# Patient Record
Sex: Male | Born: 2000 | Race: White | Hispanic: No | Marital: Single | Smoking: Current some day smoker
Health system: Southern US, Community
[De-identification: ages and names within clinical notes are randomized; demographics above are authoritative.]

## PROBLEM LIST (undated history)

## (undated) DIAGNOSIS — I350 Nonrheumatic aortic (valve) stenosis: Secondary | ICD-10-CM

## (undated) DIAGNOSIS — F913 Oppositional defiant disorder: Secondary | ICD-10-CM

## (undated) DIAGNOSIS — F988 Other specified behavioral and emotional disorders with onset usually occurring in childhood and adolescence: Secondary | ICD-10-CM

## (undated) HISTORY — PX: CARDIAC SURGERY: SHX584

---
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
## 2018-04-03 IMAGING — US US EXTREM LOW VENOUS*R*
1 series · 13 of 24 positions shown · non-contrast
Comparison: None.

ADDENDUM:
Addendum created to address a transcription error.
CLINICAL DATA: 15-year-old male with a history of pain and swelling



[Series 1: us extrem low venous*right* · 0.05mm/px · 13 of 34 slices shown]
[im 1/34]
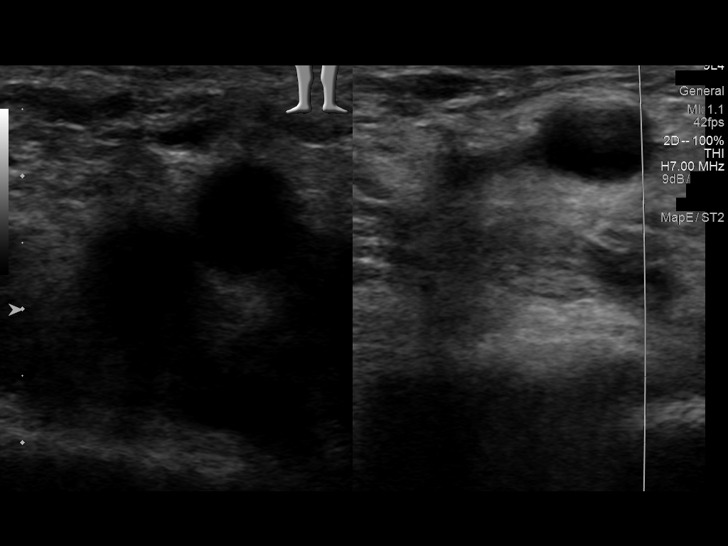
[im 3/34]
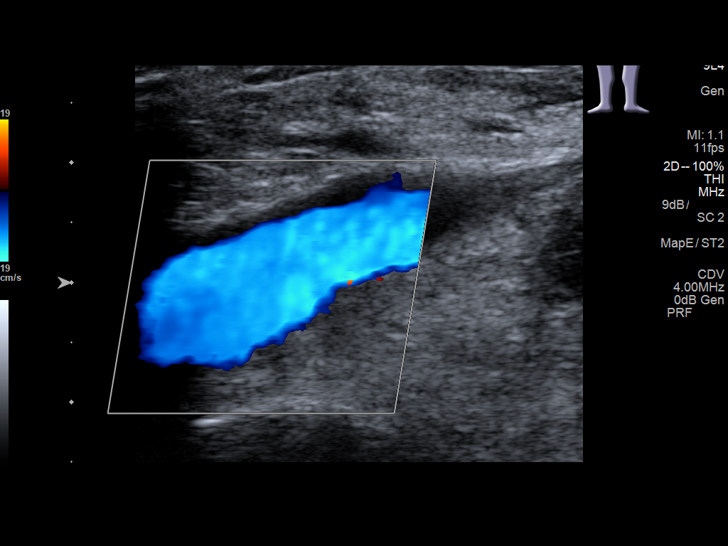
[im 6/34]
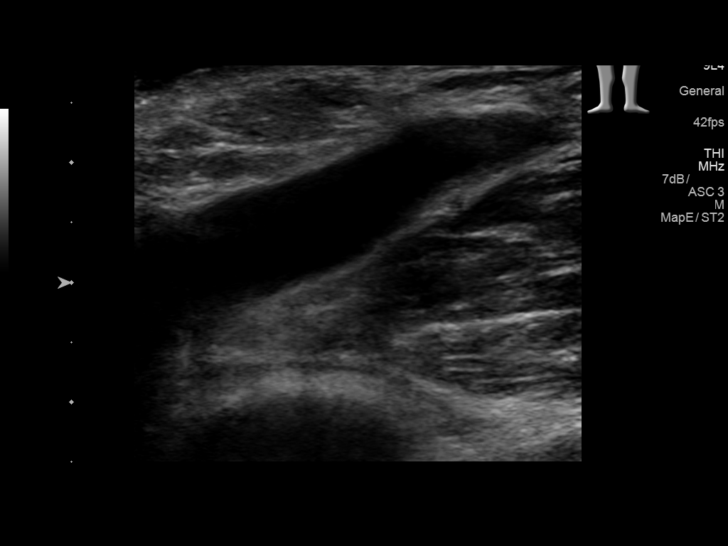
[im 9/34]
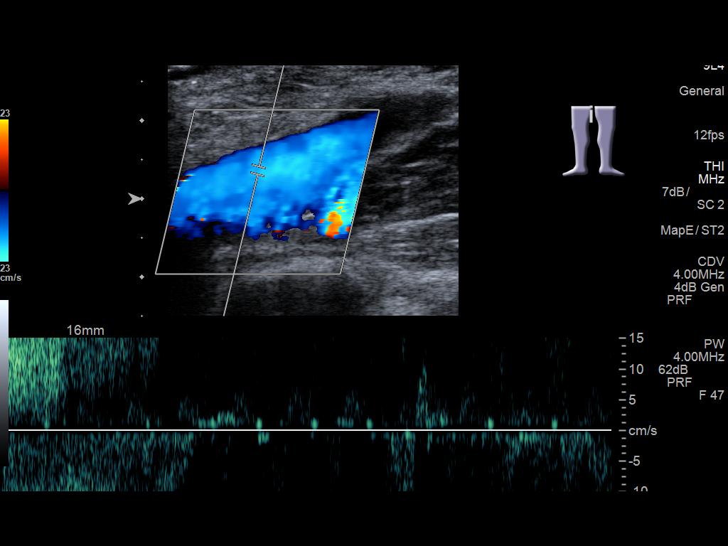
[im 12/34]
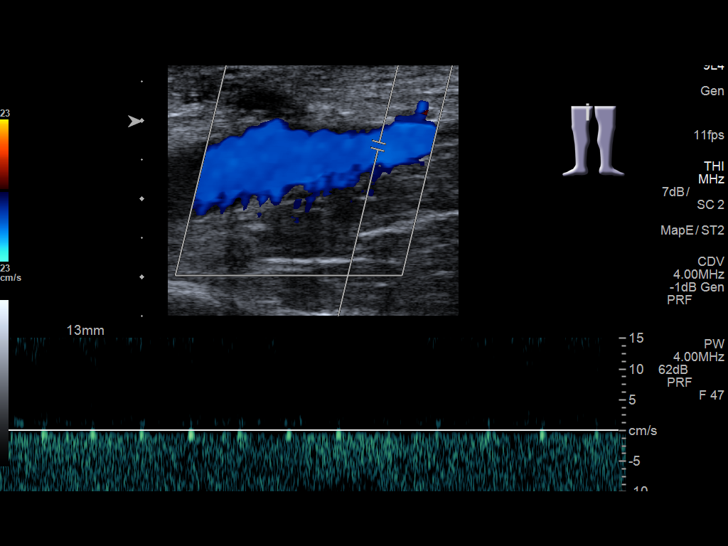
[im 15/34]
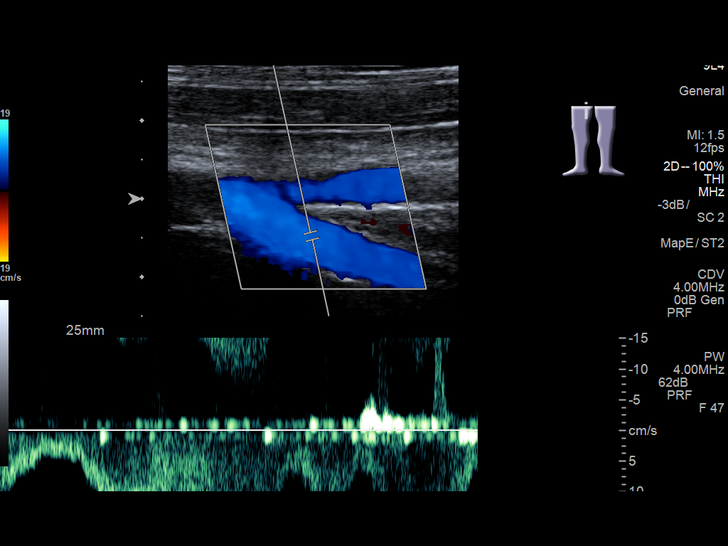
[im 18/34]
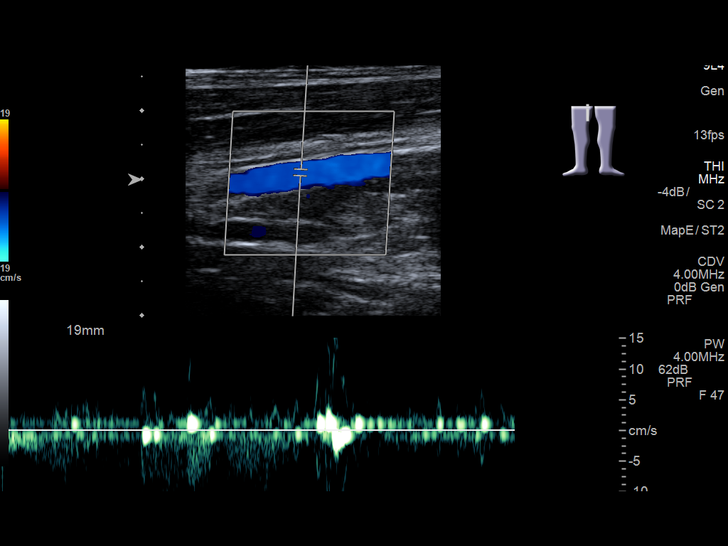
[im 19/34]
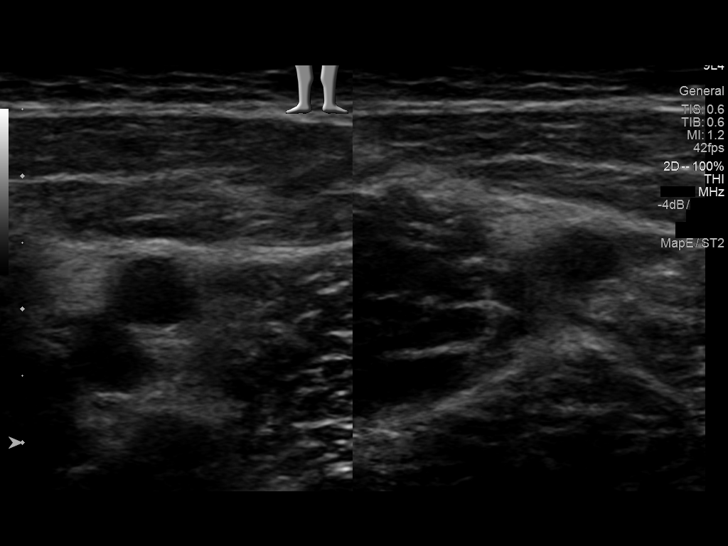
[im 22/34]
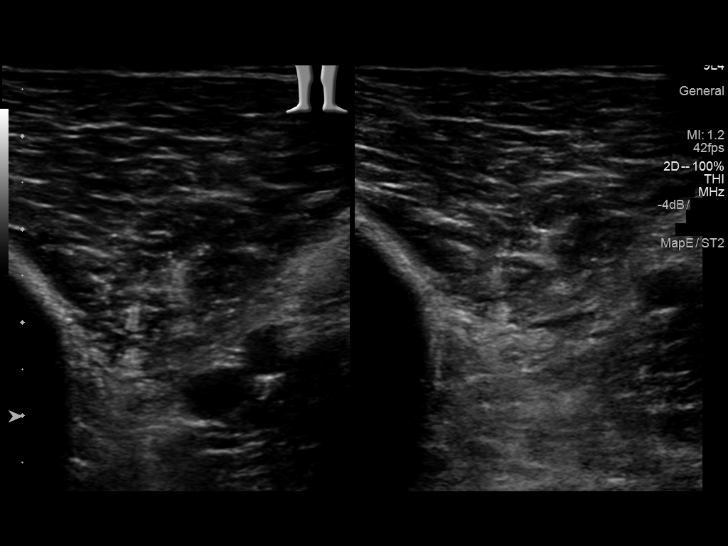
[im 25/34]
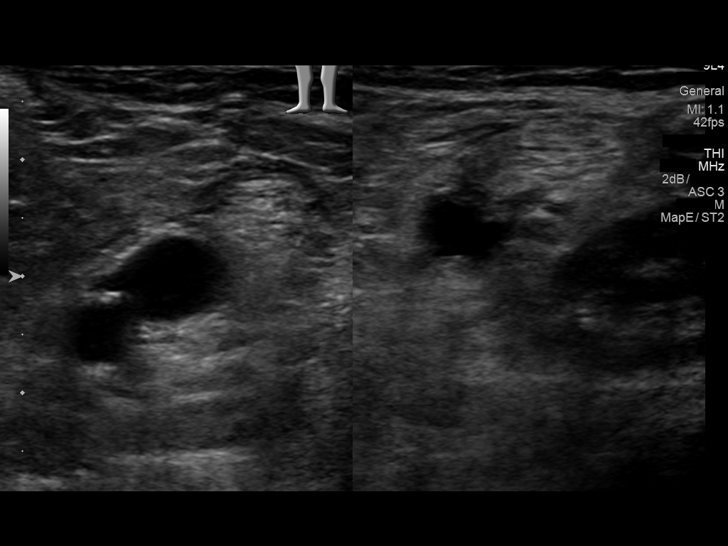
[im 28/34]
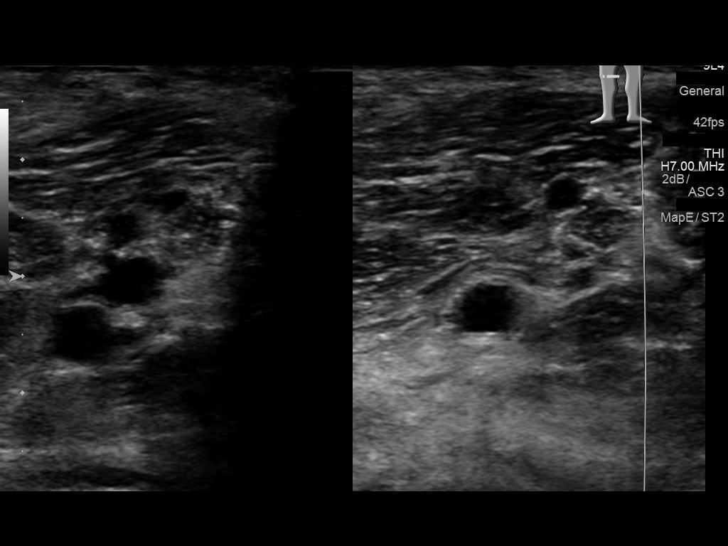
[im 31/34]
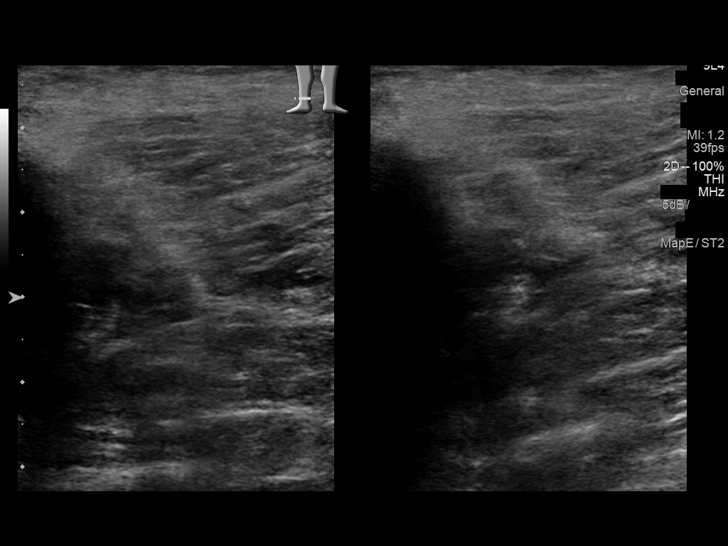
[im 34/34]
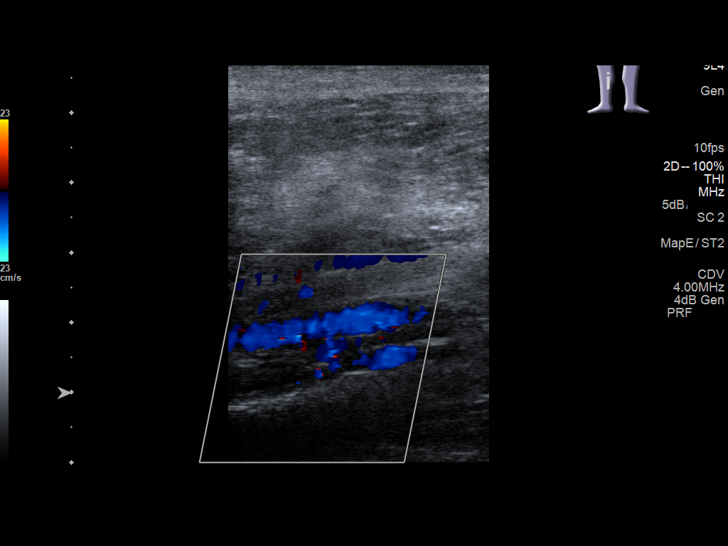

[13 of 24 positions shown; findings below may reference images not displayed]

IMPRESSION: Sonographic survey of the RIGHT lower extremity negative for DVT.

These results were discussed by telephone at the time of correction
on 12/09/2015 at [DATE] to Dr. JOVYDAS AGRA.
FINDINGS: Contralateral Common Femoral Vein: Respiratory phasicity is normal
and symmetric with the symptomatic side. No evidence of thrombus.
Normal compressibility.

Common Femoral Vein: No evidence of thrombus. Normal
compressibility, respiratory phasicity and response to augmentation.

Saphenofemoral Junction: No evidence of thrombus. Normal
compressibility and flow on color Doppler imaging.

Profunda Femoral Vein: No evidence of thrombus. Normal
compressibility and flow on color Doppler imaging.

Femoral Vein: No evidence of thrombus. Normal compressibility,
respiratory phasicity and response to augmentation.

Popliteal Vein: No evidence of thrombus. Normal compressibility,
respiratory phasicity and response to augmentation.

Calf Veins: No evidence of thrombus. Normal compressibility and flow
on color Doppler imaging.

Superficial Great Saphenous Vein: No evidence of thrombus. Normal
compressibility and flow on color Doppler imaging.

Other Findings:  None.
IMPRESSION: Sonographic survey of the left lower extremity negative for DVT.

## 2018-06-06 ENCOUNTER — Other Ambulatory Visit: Payer: Self-pay

## 2018-06-06 ENCOUNTER — Encounter: Payer: Self-pay | Admitting: Emergency Medicine

## 2018-06-06 ENCOUNTER — Emergency Department
Admission: EM | Admit: 2018-06-06 | Discharge: 2018-06-08 | Disposition: A | Discharge status: Other Acute Inpatient Hospital | Payer: Medicaid Other | Attending: Emergency Medicine | Admitting: Emergency Medicine

## 2018-06-06 DIAGNOSIS — Z79899 Other long term (current) drug therapy: Secondary | ICD-10-CM | POA: Diagnosis not present

## 2018-06-06 DIAGNOSIS — Q860 Fetal alcohol syndrome (dysmorphic): Secondary | ICD-10-CM | POA: Diagnosis present

## 2018-06-06 DIAGNOSIS — R440 Auditory hallucinations: Secondary | ICD-10-CM | POA: Diagnosis present

## 2018-06-06 DIAGNOSIS — R45851 Suicidal ideations: Secondary | ICD-10-CM

## 2018-06-06 DIAGNOSIS — F902 Attention-deficit hyperactivity disorder, combined type: Secondary | ICD-10-CM | POA: Diagnosis present

## 2018-06-06 DIAGNOSIS — F3132 Bipolar disorder, current episode depressed, moderate: Secondary | ICD-10-CM | POA: Diagnosis present

## 2018-06-06 DIAGNOSIS — F17210 Nicotine dependence, cigarettes, uncomplicated: Secondary | ICD-10-CM | POA: Diagnosis not present

## 2018-06-06 DIAGNOSIS — F319 Bipolar disorder, unspecified: Secondary | ICD-10-CM | POA: Insufficient documentation

## 2018-06-06 DIAGNOSIS — Z046 Encounter for general psychiatric examination, requested by authority: Secondary | ICD-10-CM | POA: Diagnosis not present

## 2018-06-06 DIAGNOSIS — R4183 Borderline intellectual functioning: Secondary | ICD-10-CM

## 2018-06-06 LAB — COMPREHENSIVE METABOLIC PANEL
ALK PHOS: 54 U/L (ref 38–126)
ALT: 19 U/L (ref 0–44)
AST: 25 U/L (ref 15–41)
Albumin: 5.1 g/dL — ABNORMAL HIGH (ref 3.5–5.0)
Anion gap: 10 (ref 5–15)
BUN: 15 mg/dL (ref 6–20)
CALCIUM: 9.5 mg/dL (ref 8.9–10.3)
CHLORIDE: 103 mmol/L (ref 98–111)
CO2: 29 mmol/L (ref 22–32)
Creatinine, Ser: 0.66 mg/dL (ref 0.61–1.24)
Glucose, Bld: 95 mg/dL (ref 70–99)
Potassium: 3.7 mmol/L (ref 3.5–5.1)
Sodium: 142 mmol/L (ref 135–145)
Total Bilirubin: 0.6 mg/dL (ref 0.3–1.2)
Total Protein: 7.7 g/dL (ref 6.5–8.1)

## 2018-06-06 LAB — URINE DRUG SCREEN, QUALITATIVE (ARMC ONLY)
Amphetamines, Ur Screen: NOT DETECTED
BENZODIAZEPINE, UR SCRN: NOT DETECTED
Barbiturates, Ur Screen: NOT DETECTED
CANNABINOID 50 NG, UR ~~LOC~~: NOT DETECTED
Cocaine Metabolite,Ur ~~LOC~~: NOT DETECTED
MDMA (Ecstasy)Ur Screen: NOT DETECTED
Methadone Scn, Ur: NOT DETECTED
Opiate, Ur Screen: NOT DETECTED
PHENCYCLIDINE (PCP) UR S: NOT DETECTED
Tricyclic, Ur Screen: NOT DETECTED

## 2018-06-06 LAB — CBC
HCT: 42.2 % (ref 39.0–52.0)
Hemoglobin: 14.9 g/dL (ref 13.0–17.0)
MCH: 29.4 pg (ref 26.0–34.0)
MCHC: 35.3 g/dL (ref 30.0–36.0)
MCV: 83.4 fL (ref 80.0–100.0)
PLATELETS: 171 10*3/uL (ref 150–400)
RBC: 5.06 MIL/uL (ref 4.22–5.81)
RDW: 13.7 % (ref 11.5–15.5)
WBC: 7.9 10*3/uL (ref 4.0–10.5)
nRBC: 0 % (ref 0.0–0.2)

## 2018-06-06 LAB — SALICYLATE LEVEL

## 2018-06-06 LAB — ETHANOL

## 2018-06-06 LAB — ACETAMINOPHEN LEVEL: Acetaminophen (Tylenol), Serum: <10 ug/mL — ABNORMAL LOW (ref 10–30)

## 2018-06-06 MED ORDER — LORAZEPAM 2 MG PO TABS: 2.0000 mg | ORAL_TABLET | ORAL | Status: DC | PRN

## 2018-06-06 MED ORDER — PROPRANOLOL HCL 20 MG PO TABS
10.0000 mg | ORAL_TABLET | Freq: Two times a day (BID) | ORAL | Status: DC
  Administered 2018-06-06: 10 mg via ORAL
  Filled 2018-06-06: qty 1

## 2018-06-06 MED ORDER — LITHIUM CARBONATE ER 450 MG PO TBCR
450.0000 mg | EXTENDED_RELEASE_TABLET | Freq: Two times a day (BID) | ORAL | Status: DC
  Filled 2018-06-06 (×2): qty 1

## 2018-06-06 MED ORDER — CLONIDINE HCL 0.1 MG PO TABS
0.0500 mg | ORAL_TABLET | Freq: Two times a day (BID) | ORAL | Status: DC
  Administered 2018-06-06 – 2018-06-08 (×4): 0.05 mg via ORAL
  Filled 2018-06-06 (×4): qty 1

## 2018-06-06 MED ORDER — RISPERIDONE 1 MG PO TABS
0.5000 mg | ORAL_TABLET | Freq: Two times a day (BID) | ORAL | Status: DC
  Filled 2018-06-06: qty 1

## 2018-06-06 MED ORDER — QUETIAPINE FUMARATE 25 MG PO TABS
100.0000 mg | ORAL_TABLET | Freq: Every day | ORAL | Status: DC
  Administered 2018-06-06 – 2018-06-07 (×2): 100 mg via ORAL
  Filled 2018-06-06 (×2): qty 4

## 2018-06-06 MED ORDER — CLONAZEPAM 0.5 MG PO TABS
1.0000 mg | ORAL_TABLET | Freq: Two times a day (BID) | ORAL | Status: DC
  Administered 2018-06-06: 1 mg via ORAL
  Filled 2018-06-06: qty 2

## 2018-06-06 MED ORDER — LEVOTHYROXINE SODIUM 50 MCG PO TABS: 25.0000 ug | ORAL_TABLET | Freq: Every day | ORAL | Status: DC

## 2018-06-06 NOTE — ED Notes (Signed)
Unable to reach legal guardian at this time. HIPAA-complaint VM left.

## 2018-06-06 NOTE — ED Provider Notes (Signed)
[facility] EMERGENCY DEPARTMENT Provider Note   CSN: [id number] Arrival date & time: 06/06/18  1155     History   Chief Complaint Chief Complaint  Patient presents with  . Suicidal    HPI Christian Alexander is a 17 y.o. male history of oppositional defiant disorder here presenting with suicidal ideation, hallucinations.  Patient has been in jail for the last few weeks.  Patient states that he has been having auditory hallucinations telling him to kill himself. In particular, he was thinking of drowning himself. He has been trying to cut his wrist.  Patient's tetanus shot is updated a year ago.  Patient was sent from jail to come to the ED for evaluation for suicidal ideation.  Patient had previous psych admissions.  The history is provided by the patient.    Past Medical History:  Diagnosis Date  . Aortic valve stenosis   . Attention deficit disorder (ADD)   . ODD (oppositional defiant disorder)     Patient Active Problem List   Diagnosis Date Noted  . 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 Bipolar 1 disorder, depressed, moderate (HCC) 08/15/2014  . Attention deficit hyperactivity disorder (ADHD), combined type, severe 08/15/2014  . ODD (oppositional defiant disorder) 08/15/2014  . 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 Homicidal ideation   . Suicidal ideation     Past Surgical History:  Procedure Laterality Date  . CARDIAC SURGERY          Home Medications    Prior to Admission medications   Medication Sig Start Date End Date Taking? Authorizing Provider  clonazePAM (KLONOPIN) 1 MG tablet Take 1 mg by mouth 2 (two) times daily.    [provider]  fluticasone (FLONASE) 50 MCG/ACT nasal spray Place 1 spray into both nostrils at bedtime.    [provider]  haloperidol lactate (HALDOL) 5 MG/ML injection Inject 5 mg into the muscle every 6 (six) hours as needed.    [provider]  levothyroxine (SYNTHROID,  LEVOTHROID) 25 MCG tablet Take 25 mcg by mouth daily before breakfast.    [provider]  lithium carbonate (ESKALITH) 450 MG CR tablet Take 450 mg by mouth 2 (two) times daily.    [provider]  LORazepam (ATIVAN) 2 MG tablet Take 2 mg by mouth every 2 (two) hours as needed for anxiety or sedation.     [provider]  Melatonin 3 MG TABS Take 3 tablets by mouth at bedtime.    [provider]  minocycline (MINOCIN,DYNACIN) 50 MG capsule Take 50 mg by mouth daily.    [provider]  polyethylene glycol (MIRALAX / GLYCOLAX) packet Take 17 g by mouth every morning.     [provider]  propranolol (INDERAL) 10 MG tablet Take 10 mg by mouth 2 (two) times daily.    [provider]  risperiDONE (RISPERDAL) 1 MG tablet Take 0.5 tablets (0.5 mg total) by mouth 2 (two) times daily. 12/11/15 12/10/16  Sharman Cheek, MD  tamsulosin (FLOMAX) 0.4 MG CAPS capsule Take 0.4 mg by mouth.    [provider]    Family History History reviewed. No pertinent family history.  Social History Social History   Tobacco Use  . Smoking status: Current Some Day Smoker    Packs/day: 1.00    Types: Cigarettes  . Smokeless tobacco: Never Used  Substance Use Topics  . Alcohol use: No  . Drug use: Yes    Types: Marijuana  Allergies   Patient has no known allergies.   Review of Systems Review of Systems  Psychiatric/Behavioral: Positive for suicidal ideas.  All other systems reviewed and are negative.    Physical Exam Updated Vital Signs BP 106/71 (BP Location: Left Arm)   Pulse 61   Temp 98.2 F (36.8 C) (Oral)   Resp 18   Ht 5\' 5"  (1.651 m)   Wt 49 kg   SpO2 100%   BMI 17.97 kg/m   Physical Exam  Constitutional: He is oriented to person, place, and time.  Suicidal   HENT:  Head: Normocephalic.  Mouth/Throat: Oropharynx is clear and moist.  Eyes: Pupils are equal, round, and reactive to light. Conjunctivae and  EOM are normal.  Neck: Normal range of motion. Neck supple.  Cardiovascular: Normal rate, regular rhythm and normal heart sounds.  Pulmonary/Chest: Effort normal and breath sounds normal. No stridor. No respiratory distress.  Abdominal: Soft. Bowel sounds are normal. He exhibits no distension. There is no tenderness.  Musculoskeletal: Normal range of motion.  Neurological: He is alert and oriented to person, place, and time. No cranial nerve deficit. Coordination normal.  Skin: Skin is warm.  Multiple cut marks on L forearm, no obvious large laceration requiring repair, no tendons exposed. Neuro vascular intact L upper extremity.   Psychiatric: He has a normal mood and affect.  Nursing note and vitals reviewed.    ED Treatments / Results  Labs (all labs ordered are listed, but only abnormal results are displayed) Labs Reviewed  COMPREHENSIVE METABOLIC PANEL - Abnormal; Notable for the following components:      Result Value   Albumin 5.1 (*)    All other components within normal limits  ACETAMINOPHEN LEVEL - Abnormal; Notable for the following components:   Acetaminophen (Tylenol), Serum <10 (*)    All other components within normal limits  ETHANOL  SALICYLATE LEVEL  CBC  URINE DRUG SCREEN, QUALITATIVE ([facility] ONLY)    EKG None  Radiology No results found.  Procedures Procedures (including critical care time)  Medications Ordered in ED Medications  LORazepam (ATIVAN) tablet 2 mg (has no administration in time range)  cloNIDine (CATAPRES) tablet 0.05 mg (has no administration in time range)  QUEtiapine (SEROQUEL) tablet 100 mg (has no administration in time range)     Initial Impression / Assessment and Plan / ED Course  I have reviewed the triage vital signs and the nursing notes.  Pertinent labs & imaging results that were available during my care of the patient were reviewed by me and considered in my medical decision making (see chart for details).    Christian Alexander is a 17 y.o. male here with auditory hallucinations, self cutting behavior. Tdap is up to date. Neurovascular intact in L upper extremity, lacerations not large enough for repair. Psych clearance labs unremarkable. IVC paperwork filled out. Medically cleared for psych eval.   3:26 PM Labs unremarkable. Dr. Toni Amend saw patient and recommend psych admission.    Final Clinical Impressions(s) / ED Diagnoses   Final diagnoses:  None    ED Discharge Orders    None       Charlynne Pander, MD 06/06/18 1527

## 2018-06-06 NOTE — ED Notes (Signed)
Patient assigned to appropriate care area   Introduced self to pt  Patient oriented to unit/care area: Informed that, for their safety, care areas are designed for safety and visiting and phone hours explained to patient. Patient verbalizes understanding, and verbal contract for safety obtained  Environment secured  

## 2018-06-06 NOTE — ED Notes (Signed)
Patient IVC and moved to [facility] 1  1650

## 2018-06-06 NOTE — ED Notes (Signed)
Ok to move Patient to [facility] per Dr. Alphonzo Lemmings.

## 2018-06-06 NOTE — ED Notes (Signed)
Patient transferred from triage to room 23, He is calm and cooperative, Patient noted to have superficial cuts to right arm, states that he did it with toothbrush in the jail, but He contracts for safety while here at this time, states that he feels discouraged, but denies si/hi or avh at this time.

## 2018-06-06 NOTE — ED Notes (Signed)
Hourly rounding reveals patient sleeping in room. No complaints, stable, in no acute distress. Q15 minute rounds and monitoring via Security Cameras to continue. 

## 2018-06-06 NOTE — ED Notes (Signed)
Patient has to be redirected to stay in room, he wanders out when staff are talking to other patients, he stands at his door and listens

## 2018-06-06 NOTE — ED Notes (Signed)
Pt's belongings placed in belongings bag. Items include: shoes, socks, shorts, t-shirt, underwear. Pt also placed his "court papers" in belongings bag. Pt denies having any cell phone, wallet, jewelry on him.

## 2018-06-06 NOTE — ED Notes (Signed)
IVC/Consult completed/Plan to Admit to BMU when sitter is available

## 2018-06-06 NOTE — ED Notes (Signed)
Report to include Situation, Background, Assessment, and Recommendations received from [facility]. Patient alert and oriented, warm and dry, in no acute distress. Patient denies HI, VH and pain. Patient states he has SI without plan and AH without command. Patient made aware of Q15 minute rounds and security cameras for their safety. Patient instructed to come to me with needs or concerns.

## 2018-06-06 NOTE — ED Notes (Signed)
Patient is talking on the phone, he is cooperative, and calm, no signs of distress.

## 2018-06-06 NOTE — ED Notes (Signed)
Hourly rounding reveals patient in room. No complaints, stable, in no acute distress. Q15 minute rounds and monitoring via Security Cameras to continue. 

## 2018-06-06 NOTE — ED Triage Notes (Signed)
First Nurse Note: Arrives with [facility] [employer] for "court ordered behavioral health evaluation".  Patient placed in Triage area with deputies.  Patient currently calm and cooperative.

## 2018-06-06 NOTE — ED Notes (Addendum)
Nurse talked with guardian ( vonda) and she states that he has attempted to hurt himself now 3 x in the last week, and he needed to evaluated to see about treatment, and that she could be reached at [phone], option 9, or [phone].

## 2018-06-06 NOTE — ED Notes (Signed)
Pt is eating lunch tray.  

## 2018-06-06 NOTE — ED Notes (Signed)
Pt given extra 2nd meal tray.

## 2018-06-06 NOTE — ED Triage Notes (Signed)
Pt presents to ED with Christian Alexander for "court ordered behavioral health eval." Paperwork with [employer]. Pt is calm, cooperative with triage. States he tried to "cut myself and drown myself." Pt currently in forensic restraints to bil ankles. Pt states he resides in [facility] in [place]. Pt states he has a legal guardian, contact either Chaya Jan [phone], ext: [phone]) or Cassandra Massenberg [phone], ext: [phone]). Forensic restraints removed by [employer].
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
## 2018-06-07 MED ORDER — CLONAZEPAM 0.5 MG PO TABS
0.5000 mg | ORAL_TABLET | Freq: Two times a day (BID) | ORAL | Status: DC
  Administered 2018-06-07 – 2018-06-08 (×3): 0.5 mg via ORAL
  Filled 2018-06-07 (×3): qty 1

## 2018-06-07 NOTE — ED Notes (Signed)
Snack and beverage given. 

## 2018-06-07 NOTE — BH Assessment (Signed)
Writer called and left a HIPPA Compliant message with guardian, [employer] (Vonda Thompson-[phone] ext [phone] & [phone]), requesting a return phone call.  Writer called and left a HIPPA Compliant message with guardian, [employer] (Cassandra Massenberg-[phone]), requesting a return phone call.  Writer the phone number provided on Cassandra's voice mail ([phone]). Was unable to reach anyone and left a HIPPA complaint Voicemail requesting a return phone call.  

## 2018-06-07 NOTE — BH Assessment (Signed)
Received return phone call from patient's guardian (Cassandra Massenberg-[phone]) and was able to obtain collateral information and will be in patient's TTS Consult.

## 2018-06-07 NOTE — ED Notes (Signed)
Patient resting quietly in room. No noted distress or abnormal behaviors noted. Will continue 15 minute checks and observation by security camera for safety. 

## 2018-06-07 NOTE — BH Assessment (Signed)
Patient is to be admitted to [facility] by Dr. Toni Amend.  Attending Physician will be Dr. Jennet Maduro.   Patient has been assigned to room 312, by [facility] Charge Nurse [place].   ER staff is aware of the admission:  Dr. Don Perking, ER MD   Amy B., Patient's Nurse   Ethelene Browns, Patient Access.
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
## 2018-06-07 NOTE — ED Notes (Signed)
Hourly rounding reveals patient sleeping in room. No complaints, stable, in no acute distress. Q15 minute rounds and monitoring via Security Cameras to continue. 

## 2018-06-07 NOTE — ED Notes (Signed)
Hourly rounding reveals patient in room. No complaints, stable, in no acute distress. Q15 minute rounds and monitoring via Security Cameras to continue. 

## 2018-06-07 NOTE — ED Notes (Signed)
IVC/  PENDING  PLACEMENT 

## 2018-06-07 NOTE — ED Notes (Signed)
Report to include Situation, Background, Assessment, and Recommendations received from RN Amy. Patient alert and oriented, warm and dry, in no acute distress. Patient denies SI, HI, VH and pain. He reported AH at night while sleeping. It tells him to cut himself. He said he wakes up and let the thought pass before he goes back to bed. Patient made aware of Q15 minute rounds and security cameras for their safety. Patient instructed to come to me with needs or concerns.

## 2018-06-07 NOTE — BH Assessment (Signed)
Per instructions of [facility] Medical Director (Dr. Alyse Low) during the "[facility] Morning Bridge Call," patient to be referred to [facility] due to him coming from jail with court order for inpatient treatment.  Writer spoke with [facility] and was advised to complete the referral process even though he was court ordered. 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  Writer spoke with patient to complete TTS Barista spoke with patient's Guardian (Vonda Thompson-[phone]) for information needed for the [facility] Referral.  Writer called and left a HIPPA Compliant message with [facility] [phone]. [phone]), requesting a return phone call. Information needed to complete the [facility] referral.  Obtained 5 denials for the referral  [facility]  [facility]  [facility]  [facility]  [facility]  Received phone call from Psych MD (Dr. Toni Amend), instructing writer patient to be admitted to [facility]. Writer informed Psych MD what was said during the morning bridge call.   Patient to be admitted to [facility].

## 2018-06-08 ENCOUNTER — Encounter: Payer: Self-pay | Admitting: Behavioral Health

## 2018-06-08 ENCOUNTER — Inpatient Hospital Stay
Admission: AD | Admit: 2018-06-08 | Discharge: 2018-06-21 | DRG: 885 | Disposition: A | Discharge status: Other Acute Inpatient Hospital | Payer: Medicaid Other | Attending: Psychiatry | Admitting: Psychiatry

## 2018-06-08 ENCOUNTER — Other Ambulatory Visit: Payer: Self-pay

## 2018-06-08 DIAGNOSIS — I361 Nonrheumatic tricuspid (valve) insufficiency: Secondary | ICD-10-CM | POA: Diagnosis not present

## 2018-06-08 DIAGNOSIS — F17210 Nicotine dependence, cigarettes, uncomplicated: Secondary | ICD-10-CM | POA: Diagnosis present

## 2018-06-08 DIAGNOSIS — Y92230 Patient room in hospital as the place of occurrence of the external cause: Secondary | ICD-10-CM | POA: Diagnosis not present

## 2018-06-08 DIAGNOSIS — R55 Syncope and collapse: Secondary | ICD-10-CM | POA: Diagnosis present

## 2018-06-08 DIAGNOSIS — T190XXA Foreign body in urethra, initial encounter: Secondary | ICD-10-CM | POA: Diagnosis not present

## 2018-06-08 DIAGNOSIS — Z79899 Other long term (current) drug therapy: Secondary | ICD-10-CM

## 2018-06-08 DIAGNOSIS — T189XXA Foreign body of alimentary tract, part unspecified, initial encounter: Secondary | ICD-10-CM | POA: Diagnosis not present

## 2018-06-08 DIAGNOSIS — F603 Borderline personality disorder: Secondary | ICD-10-CM | POA: Diagnosis present

## 2018-06-08 DIAGNOSIS — R0789 Other chest pain: Secondary | ICD-10-CM | POA: Diagnosis present

## 2018-06-08 DIAGNOSIS — R45851 Suicidal ideations: Secondary | ICD-10-CM | POA: Diagnosis present

## 2018-06-08 DIAGNOSIS — R339 Retention of urine, unspecified: Secondary | ICD-10-CM | POA: Diagnosis present

## 2018-06-08 DIAGNOSIS — F319 Bipolar disorder, unspecified: Secondary | ICD-10-CM | POA: Diagnosis not present

## 2018-06-08 DIAGNOSIS — G4700 Insomnia, unspecified: Secondary | ICD-10-CM | POA: Diagnosis present

## 2018-06-08 DIAGNOSIS — R4183 Borderline intellectual functioning: Secondary | ICD-10-CM | POA: Diagnosis present

## 2018-06-08 DIAGNOSIS — X788XXA Intentional self-harm by other sharp object, initial encounter: Secondary | ICD-10-CM | POA: Diagnosis not present

## 2018-06-08 DIAGNOSIS — Z954 Presence of other heart-valve replacement: Secondary | ICD-10-CM | POA: Diagnosis not present

## 2018-06-08 DIAGNOSIS — Q249 Congenital malformation of heart, unspecified: Secondary | ICD-10-CM

## 2018-06-08 DIAGNOSIS — Z818 Family history of other mental and behavioral disorders: Secondary | ICD-10-CM

## 2018-06-08 DIAGNOSIS — F23 Brief psychotic disorder: Secondary | ICD-10-CM | POA: Diagnosis present

## 2018-06-08 DIAGNOSIS — Z6221 Child in welfare custody: Secondary | ICD-10-CM | POA: Diagnosis present

## 2018-06-08 DIAGNOSIS — T185XXA Foreign body in anus and rectum, initial encounter: Secondary | ICD-10-CM

## 2018-06-08 DIAGNOSIS — Z79891 Long term (current) use of opiate analgesic: Secondary | ICD-10-CM

## 2018-06-08 DIAGNOSIS — Z9889 Other specified postprocedural states: Secondary | ICD-10-CM

## 2018-06-08 DIAGNOSIS — F913 Oppositional defiant disorder: Secondary | ICD-10-CM | POA: Diagnosis present

## 2018-06-08 DIAGNOSIS — R000 Tachycardia, unspecified: Secondary | ICD-10-CM | POA: Diagnosis present

## 2018-06-08 DIAGNOSIS — X838XXA Intentional self-harm by other specified means, initial encounter: Secondary | ICD-10-CM | POA: Diagnosis not present

## 2018-06-08 DIAGNOSIS — T182XXA Foreign body in stomach, initial encounter: Secondary | ICD-10-CM

## 2018-06-08 DIAGNOSIS — R4189 Other symptoms and signs involving cognitive functions and awareness: Secondary | ICD-10-CM

## 2018-06-08 DIAGNOSIS — R079 Chest pain, unspecified: Secondary | ICD-10-CM

## 2018-06-08 DIAGNOSIS — Q860 Fetal alcohol syndrome (dysmorphic): Secondary | ICD-10-CM | POA: Diagnosis not present

## 2018-06-08 DIAGNOSIS — Z5320 Procedure and treatment not carried out because of patient's decision for unspecified reasons: Secondary | ICD-10-CM | POA: Diagnosis not present

## 2018-06-08 DIAGNOSIS — F314 Bipolar disorder, current episode depressed, severe, without psychotic features: Secondary | ICD-10-CM | POA: Diagnosis present

## 2018-06-08 DIAGNOSIS — F902 Attention-deficit hyperactivity disorder, combined type: Secondary | ICD-10-CM | POA: Diagnosis present

## 2018-06-08 DIAGNOSIS — K5900 Constipation, unspecified: Secondary | ICD-10-CM | POA: Diagnosis present

## 2018-06-08 MED ORDER — CLONIDINE HCL 0.1 MG PO TABS
0.0500 mg | ORAL_TABLET | Freq: Two times a day (BID) | ORAL | Status: DC
  Filled 2018-06-08 (×2): qty 1

## 2018-06-08 MED ORDER — QUETIAPINE FUMARATE 100 MG PO TABS
100.0000 mg | ORAL_TABLET | Freq: Every day | ORAL | Status: DC
  Administered 2018-06-08: 100 mg via ORAL
  Filled 2018-06-08: qty 1

## 2018-06-08 MED ORDER — ACETAMINOPHEN 325 MG PO TABS
650.0000 mg | ORAL_TABLET | Freq: Four times a day (QID) | ORAL | Status: DC | PRN
  Administered 2018-06-10: 650 mg via ORAL
  Filled 2018-06-08: qty 2

## 2018-06-08 MED ORDER — TRAZODONE HCL 100 MG PO TABS
100.0000 mg | ORAL_TABLET | Freq: Every evening | ORAL | Status: DC | PRN
  Administered 2018-06-08: 100 mg via ORAL
  Filled 2018-06-08: qty 1

## 2018-06-08 MED ORDER — ASENAPINE MALEATE 5 MG SL SUBL
10.0000 mg | SUBLINGUAL_TABLET | Freq: Three times a day (TID) | SUBLINGUAL | Status: DC | PRN
  Filled 2018-06-08: qty 2

## 2018-06-08 MED ORDER — NICOTINE POLACRILEX 2 MG MT GUM
2.0000 mg | CHEWING_GUM | OROMUCOSAL | Status: DC | PRN
  Administered 2018-06-09 – 2018-06-21 (×4): 2 mg via ORAL
  Filled 2018-06-08 (×4): qty 1

## 2018-06-08 MED ORDER — ALUM & MAG HYDROXIDE-SIMETH 200-200-20 MG/5ML PO SUSP: 30.0000 mL | ORAL | Status: DC | PRN

## 2018-06-08 MED ORDER — MAGNESIUM HYDROXIDE 400 MG/5ML PO SUSP
30.0000 mL | Freq: Every day | ORAL | Status: DC | PRN
  Administered 2018-06-16: 30 mL via ORAL
  Filled 2018-06-08 (×2): qty 30

## 2018-06-08 MED ORDER — HYDROXYZINE HCL 50 MG PO TABS: 50.0000 mg | ORAL_TABLET | Freq: Three times a day (TID) | ORAL | Status: DC | PRN

## 2018-06-08 MED ORDER — CLONAZEPAM 0.5 MG PO TABS
0.5000 mg | ORAL_TABLET | Freq: Two times a day (BID) | ORAL | Status: DC
  Administered 2018-06-08 – 2018-06-09 (×2): 0.5 mg via ORAL
  Filled 2018-06-08 (×2): qty 1

## 2018-06-08 NOTE — Progress Notes (Signed)
LCSW met with [employer] Coordinator Sherrine Maples [phone], she came by to have face to face with patient. She is covering for Sharlett Iles his reg care coordinator   LCSW has collected data to complete his BMU assessment and will put in information tomorrow. Patient is to return to [facility] and has current court dates of October 14th and one court appearance in November. He is attached to [facility]. ( this will need to be confirmed with guardian ) awaiting a call back.   Elenora Fender [employer] is his Guardian. Patient reports he is doing better and will return to group home on Monday or when doctor says I can leave. Patient was calm and polite.   [employer] LCSW [phone]

## 2018-06-08 NOTE — ED Notes (Signed)
Pt was given a breakfast tray and a juice. Nothing else needed from staff

## 2018-06-08 NOTE — ED Notes (Signed)
Patient is in the dayroom watching television, anxiously waiting for his inpatient admission. Patient has been appropriate and cooperative. NAD noted. Denies HI/SI/AVH.

## 2018-06-08 NOTE — BHH Suicide Risk Assessment (Signed)
[facility] Admission Suicide Risk Assessment   Nursing information obtained from:  Patient Demographic factors:  Male Current Mental Status:  Self-harm behaviors Loss Factors:  Legal issues Historical Factors:  Prior suicide attempts, Impulsivity Risk Reduction Factors:  Living with another person, especially a relative  Total Time spent with patient: 1 hour Principal Problem: <principal problem not specified> Diagnosis:   Patient Active Problem List   Diagnosis Date Noted  . Bipolar 1 disorder, depressed (HCC) [F31.9] 06/08/2018  . 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 Bipolar 1 disorder, depressed, moderate (HCC) [F31.32] 08/15/2014  . 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 ODD (oppositional defiant disorder) [F91.3] 08/15/2014  . 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 Homicidal ideation [R45.850]   . Suicidal ideation [R45.851]    Subjective Data: suicidal ideation  Continued Clinical Symptoms:  Alcohol Use Disorder Identification Test Final Score (AUDIT): 0 The "Alcohol Use Disorders Identification Test", Guidelines for Use in Primary Care, Second Edition.  [employer] [facility]). Score between 0-7:  no or low risk or alcohol related problems. Score between 8-15:  moderate risk of alcohol related problems. Score between 16-19:  high risk of alcohol related problems. Score 20 or above:  warrants further diagnostic evaluation for alcohol dependence and treatment.   CLINICAL FACTORS:   Depression:   Impulsivity Severe   Musculoskeletal: Strength & Muscle Tone: within normal limits Gait & Station: normal Patient leans: N/A  Psychiatric Specialty Exam: Physical Exam  Nursing note and vitals reviewed. Psychiatric: He has a normal mood and affect. His speech is normal and behavior is normal. Thought content normal. Cognition and memory are normal. He expresses impulsivity.    Review of  Systems  Neurological: Negative.   Psychiatric/Behavioral: Positive for depression and suicidal ideas.  All other systems reviewed and are negative.   Blood pressure 110/78, pulse 82, temperature 97.6 F (36.4 C), temperature source Oral, resp. rate 16, height 5' 2.01" (1.575 m), weight 49 kg, SpO2 99 %.Body mass index is 19.75 kg/m.  General Appearance: Casual  Eye Contact:  Good  Speech:  Clear and Coherent  Volume:  Normal  Mood:  Depressed  Affect:  Appropriate  Thought Process:  Goal Directed and Descriptions of Associations: Intact  Orientation:  Full (Time, Place, and Person)  Thought Content:  WDL  Suicidal Thoughts:  No  Homicidal Thoughts:  No  Memory:  Immediate;   Fair Recent;   Fair Remote;   Fair  Judgement:  Impaired  Insight:  Shallow  Psychomotor Activity:  Normal  Concentration:  Concentration: Fair and Attention Span: Fair  Recall:  [employer] of Knowledge:  Fair  Language:  Fair  Akathisia:  No  Handed:  Right  AIMS (if indicated):     Assets:  Communication Skills Desire for Improvement Financial Resources/Insurance Housing Physical Health Resilience  ADL's:  Intact  Cognition:  WNL  Sleep:         COGNITIVE FEATURES THAT CONTRIBUTE TO RISK:  None    SUICIDE RISK:   Minimal: No identifiable suicidal ideation.  Patients presenting with no risk factors but with morbid ruminations; may be classified as minimal risk based on the severity of the depressive symptoms  PLAN OF CARE: hospital admission, medication management, discharge planning.  Mr. Bigley is a 17 year old male with a history of mood instability and self injurious behavior admitted for repeated suicide attempts while in jail.  #Agitation, resolved -Saphris 10 mg SL TID available  #Mood -  continue Seroquel 100 mg nightly -Trazodone 100 mg nightly PRN -Clonidine 0.05 mg BID -Clonazepam 0.5 mg BID  Labs #lipid panel, TSH, A1C  #Social -incompetent adult -[facility]SS is the  guardian  #Disposition -he will return to his group home -follow up      I certify that inpatient services furnished can reasonably be expected to improve the patient's condition.   Kristine Linea, MD 06/08/2018, 5:40 PM

## 2018-06-08 NOTE — ED Notes (Signed)
Hourly rounding reveals patient in room. No complaints, stable, in no acute distress. Q15 minute rounds and monitoring via Security Cameras to continue. 

## 2018-06-08 NOTE — ED Notes (Signed)
Pt took a shower and was given a new change of clothes, a new pair of socks, a bottle of soap/deodorant, and a clean pair of under ware. This EDT received all old and used articles back from pt. Nothing needed from staff at this time.

## 2018-06-08 NOTE — ED Notes (Signed)
Report called to [facility] nurse. Patient aware of plan of care and agreeable with plan.

## 2018-06-08 NOTE — ED Notes (Signed)
Patient taking a shower.

## 2018-06-08 NOTE — ED Provider Notes (Signed)
-----------------------------------------   3:58 AM on 06/08/2018 -----------------------------------------   Blood pressure 105/63, pulse 80, temperature 97.7 F (36.5 C), temperature source Oral, resp. rate 12, height 5\' 5"  (1.651 m), weight 49 kg, SpO2 100 %.  The patient had no acute events since last update.  Calm and cooperative at this time.  Pending bed downstairs.   Merrily Brittle, MD 06/08/18 [phone]

## 2018-06-08 NOTE — Plan of Care (Addendum)
Patient is a 17 year old, admitted to the hospital after being discharged from jail due to trying to kill himself. During assessment on the unit patient states, "I was trying to drown and cut myself in jail, and now I'm her and I can't go back there no more." Patient states he smokes cigarettes and is requesting nicotine gum instead of patch. Patient states he takes xanax from his doctor as well as from other people. Patient has 6 superficial, healing cuts to left forearm. When patient was asked, "Why would you cut yourself?" Patient responded, "relieve pressure I only cut this arm." Patient has an old healed surgical scar in center of chest. Patient informed nurse of open heart surgery when he was 17 years old. On admission patient denies SI, HI and AVH. Patient is childlike but assertive; cooperative, makes brief eye contact, and will ask for meaning of words if not comprehending. Patient verbalized agreement to let staff know if thoughts of SI, HI or AVH occurs. No contaband was found on person. Patient oriented to unit, received dinner tray. No self injurious behaviors observed or reported. Problem: Education: Goal: Knowledge of [facility] Education information/materials will improve Outcome: Not Progressing Goal: Emotional status will improve Outcome: Not Progressing Goal: Mental status will improve Outcome: Not Progressing Goal: Verbalization of understanding the information provided will improve Outcome: Not Progressing   Problem: Coping: Goal: Ability to interact with others will improve Outcome: Not Progressing Goal: Demonstration of participation in decision-making regarding own care will improve Outcome: Not Progressing   Problem: Self-Concept: Goal: Will verbalize positive feelings about self Outcome: Not Progressing

## 2018-06-08 NOTE — ED Notes (Signed)
This Clinical research associate took over care of patient. Patient alert and oriented x 4. Patient in dayroom awaiting transfer to BMU.

## 2018-06-08 NOTE — ED Notes (Signed)
Hourly rounding reveals patient sleeping in room. No complaints, stable, in no acute distress. Q15 minute rounds and monitoring via Security Cameras to continue. 
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
## 2018-06-08 NOTE — Tx Team (Signed)
Initial Treatment Plan 06/08/2018 5:37 PM Wyman Songster ZOX:[id number]    PATIENT STRESSORS: Legal issue Medication change or noncompliance   PATIENT STRENGTHS: Communication skills Supportive family/friends   PATIENT IDENTIFIED PROBLEMS: Ineffective Coping  Disturbed thought process  Risk for self mutilation                 DISCHARGE CRITERIA:  Ability to meet basic life and health needs Improved stabilization in mood, thinking, and/or behavior Motivation to continue treatment in a less acute level of care  PRELIMINARY DISCHARGE PLAN: Outpatient therapy Return to previous living arrangement  PATIENT/FAMILY INVOLVEMENT: This treatment plan has been presented to and reviewed with the patient, Christian Alexander, and/or family member,  The patient and family have been given the opportunity to ask questions and make suggestions.  Leamon Arnt, RN 06/08/2018, 5:37 PM

## 2018-06-09 DIAGNOSIS — Z954 Presence of other heart-valve replacement: Secondary | ICD-10-CM

## 2018-06-09 LAB — LIPID PANEL
CHOL/HDL RATIO: 2.4 ratio
Cholesterol: 140 mg/dL (ref 0–200)
HDL: 59 mg/dL (ref >40)
LDL CALC: 71 mg/dL (ref 0–99)
Triglycerides: 50 mg/dL (ref <150)
VLDL: 10 mg/dL (ref 0–40)

## 2018-06-09 LAB — HEMOGLOBIN A1C
Hgb A1c MFr Bld: 4.5 % — ABNORMAL LOW (ref 4.8–5.6)
Mean Plasma Glucose: 82.45 mg/dL

## 2018-06-09 LAB — TSH: TSH: 2.179 u[IU]/mL (ref 0.350–4.500)

## 2018-06-09 MED ORDER — ALPRAZOLAM 1 MG PO TABS
1.0000 mg | ORAL_TABLET | Freq: Two times a day (BID) | ORAL | Status: DC
  Administered 2018-06-10 – 2018-06-21 (×21): 1 mg via ORAL
  Filled 2018-06-09 (×22): qty 1

## 2018-06-09 MED ORDER — ASENAPINE MALEATE 5 MG SL SUBL
10.0000 mg | SUBLINGUAL_TABLET | Freq: Three times a day (TID) | SUBLINGUAL | Status: DC | PRN
  Administered 2018-06-09: 10 mg via SUBLINGUAL
  Filled 2018-06-09 (×2): qty 2

## 2018-06-09 MED ORDER — QUETIAPINE FUMARATE 25 MG PO TABS
50.0000 mg | ORAL_TABLET | Freq: Every day | ORAL | Status: DC
  Administered 2018-06-09 – 2018-06-17 (×9): 50 mg via ORAL
  Filled 2018-06-09 (×10): qty 2

## 2018-06-09 MED ORDER — ASENAPINE MALEATE 5 MG SL SUBL
5.0000 mg | SUBLINGUAL_TABLET | Freq: Three times a day (TID) | SUBLINGUAL | Status: DC | PRN
  Administered 2018-06-10: 5 mg via SUBLINGUAL
  Filled 2018-06-09 (×2): qty 1

## 2018-06-09 MED ORDER — ALPRAZOLAM 1 MG PO TABS
1.0000 mg | ORAL_TABLET | Freq: Two times a day (BID) | ORAL | Status: DC
  Administered 2018-06-09: 1 mg via ORAL
  Filled 2018-06-09: qty 1

## 2018-06-09 MED ORDER — TAMSULOSIN HCL 0.4 MG PO CAPS
0.4000 mg | ORAL_CAPSULE | Freq: Every day | ORAL | Status: DC
  Administered 2018-06-09 – 2018-06-21 (×12): 0.4 mg via ORAL
  Filled 2018-06-09 (×11): qty 1

## 2018-06-09 NOTE — Progress Notes (Addendum)
[facility] Christian Alexander Progress Note  06/09/2018 10:58 AM Christian Alexander  MRN:  [id number]  Subjective:   Christian Alexander is feeling anxious today and became slightly antsy. Requested to talk to urgently. No longer likes Clonazepam and is asking for Xanax. Had low BP this morning, unable to tolerate Clonidine that has been useful in the past. Yesterday, he became upset with another patient but was able to walk away from the situation. He is pleasant, talkative insightful.   Reported tachycardia and SOB that woke him up last night. 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 Spoke with Christian Alexander, cardiology, who recommended EKG if another episode of tachycardia. We could request cardiac ECHO but patient needs to follow up with his primary cardiologist at [facility] with urologist, Christian Alexander, who recommends caths in this patient with a history of inserting foreign objects into his urethra. Flomax may be unnecessary unless there is prostate swelling.    Principal Problem: Bipolar I disorder, most recent episode depressed, severe without psychotic features (HCC) Diagnosis:   Patient Active Problem List   Diagnosis Date Noted  . Bipolar I disorder, most recent episode depressed, severe without psychotic features (HCC) [F31.4] 06/08/2018    Priority: High  . 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 Bipolar 1 disorder, depressed, moderate (HCC) [F31.32] 08/15/2014  . 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 ODD (oppositional defiant disorder) [F91.3] 08/15/2014  . 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 Homicidal ideation [R45.850]   . Suicidal ideation [R45.851]    Total Time spent with patient: 20 minutes  Past Psychiatric History: bipolar disorder  Past Medical History:  Past Medical History:  Diagnosis Date  . Aortic valve stenosis   . Attention deficit disorder  (ADD)   . ODD (oppositional defiant disorder)     Past Surgical History:  Procedure Laterality Date  . CARDIAC SURGERY     Family History: History reviewed. No pertinent family history. Family Psychiatric  History: father with mental illness Social History:  Social History   Substance and Sexual Activity  Alcohol Use No     Social History   Substance and Sexual Activity  Drug Use Yes  . Types: Marijuana    Social History   Socioeconomic History  . Marital status: Single    Spouse name: Not on file  . Number of children: Not on file  . Years of education: Not on file  . Highest education level: Not on file  Occupational History  . Not on file  Social Needs  . Financial resource strain: Not on file  . Food insecurity:    Worry: Not on file    Inability: Not on file  . Transportation needs:    Medical: Not on file    Non-medical: Not on file  Tobacco Use  . Smoking status: Current Some Day Smoker    Packs/day: 1.00    Types: Cigarettes  . Smokeless tobacco: Never Used  Substance and Sexual Activity  . Alcohol use: No  . Drug use: Yes    Types: Marijuana  . Sexual activity: Never  Lifestyle  . Physical activity:    Days per week: Not on file    Minutes per session: Not on file  . Stress: Not on file  Relationships  . Social connections:    Talks on phone: Not on file    Gets together: Not on file    Attends  religious service: Not on file    Active member of club or organization: Not on file    Attends meetings of clubs or organizations: Not on file    Relationship status: Not on file  Other Topics Concern  . Not on file  Social History Narrative  . Not on file   Additional Social History:                         Sleep: Fair  Appetite:  Fair  Current Medications: Current Facility-Administered Medications  Medication Dose Route Frequency Provider Last Rate Last Dose  . acetaminophen (TYLENOL) tablet 650 mg  650 mg Oral Q6H PRN Clapacs,  Christian Alexander, Christian Alexander      . ALPRAZolam Prudy Feeler) tablet 1 mg  1 mg Oral BID Ebonie Westerlund B, Christian Alexander      . alum & mag hydroxide-simeth (MAALOX/MYLANTA) 200-200-20 MG/5ML suspension 30 mL  30 mL Oral Q4H PRN Clapacs, Christian Alexander, Christian Alexander      . asenapine (SAPHRIS) sublingual tablet 10 mg  10 mg Sublingual TID PRN Shoua Ressler B, Christian Alexander      . cloNIDine (CATAPRES) tablet 0.05 mg  0.05 mg Oral BID Clapacs, Christian Denmark, Christian Alexander   Stopped at 06/09/18 [phone]  . magnesium hydroxide (MILK OF MAGNESIA) suspension 30 mL  30 mL Oral Daily PRN Clapacs, Christian Alexander, Christian Alexander      . nicotine polacrilex (NICORETTE) gum 2 mg  2 mg Oral PRN Christian Alexander  B, Christian Alexander      . QUEtiapine (SEROQUEL) tablet 50 mg  50 mg Oral QHS Roni Scow B, Christian Alexander      . tamsulosin (FLOMAX) capsule 0.4 mg  0.4 mg Oral QPC breakfast Lashawnna Lambrecht B, Christian Alexander        Lab Results:  Results for orders placed or performed during the hospital encounter of 06/08/18 (from the past 48 hour(s))  Lipid panel     Status: None   Collection Time: 06/09/18  7:17 AM  Result Value Ref Range   Cholesterol 140 0 - 200 mg/dL   Triglycerides 50 <960 mg/dL   HDL 59 >45 mg/dL   Total CHOL/HDL Ratio 2.4 RATIO   VLDL 10 0 - 40 mg/dL   LDL Cholesterol 71 0 - 99 mg/dL    Comment:        Total Cholesterol/HDL:CHD Risk Coronary Heart Disease Risk Table                     Men   Women  1/2 Average Risk   3.4   3.3  Average Risk       5.0   4.4  2 X Average Risk   9.6   7.1  3 X Average Risk  23.4   11.0        Use the calculated Patient Ratio above and the CHD Risk Table to determine the patient's CHD Risk.        ATP III CLASSIFICATION (LDL):  <100     mg/dL   Optimal  409-811  mg/dL   Near or Above                    Optimal  130-159  mg/dL   Borderline  914-782  mg/dL   High  >956     mg/dL   Very High Performed at [facility], [address], [place], [place] [address]   TSH     Status: None  Collection Time: 06/09/18  7:17 AM  Result Value Ref Range   TSH  2.179 0.350 - 4.500 uIU/mL    Comment: Performed by a 3rd Generation assay with a functional sensitivity of <=0.01 uIU/mL. Performed at [facility], [address] [place], [place] [address]     Blood Alcohol level:  Lab Results  Component Value Date   [facility] <10 06/06/2018   ETH <5 12/13/2015    Metabolic Disorder Labs: Lab Results  Component Value Date   HGBA1C 5.1 08/16/2014   MPG 100 08/16/2014   No results found for: PROLACTIN Lab Results  Component Value Date   CHOL 140 06/09/2018   TRIG 50 06/09/2018   HDL 59 06/09/2018   CHOLHDL 2.4 06/09/2018   VLDL 10 06/09/2018   LDLCALC 71 06/09/2018   LDLCALC 57 08/16/2014    Physical Findings: AIMS:  , ,  ,  ,    CIWA:    COWS:     Musculoskeletal: Strength & Muscle Tone: within normal limits Gait & Station: normal Patient leans: N/A  Psychiatric Specialty Exam: Physical Exam  Nursing note and vitals reviewed. Psychiatric: He has a normal mood and affect. His speech is normal and behavior is normal. Thought content normal. Cognition and memory are normal. He expresses impulsivity.    Review of Systems  Neurological: Negative.   Psychiatric/Behavioral: Negative.   All other systems reviewed and are negative.   Blood pressure (!) 98/52, pulse 80, temperature (!) 97.4 F (36.3 C), temperature source Oral, resp. rate 18, height 5' 2.01" (1.575 m), weight 49 kg, SpO2 100 %.Body mass index is 19.75 kg/m.  General Appearance: Fairly Groomed  Eye Contact:  Good  Speech:  Clear and Coherent  Volume:  Normal  Mood:  Depressed  Affect:  Blunt  Thought Process:  Goal Directed and Descriptions of Associations: Intact  Orientation:  Full (Time, Place, and Person)  Thought Content:  WDL  Suicidal Thoughts:  No  Homicidal Thoughts:  No  Memory:  Immediate;   Fair Recent;   Fair Remote;   Fair  Judgement:  Poor  Insight:  Lacking  Psychomotor Activity:  Decreased  Concentration:  Concentration: Fair and  Attention Span: Fair  Recall:  [employer] of Knowledge:  Fair  Language:  Fair  Akathisia:  No  Handed:  Right  AIMS (if indicated):     Assets:  Communication Skills Desire for Improvement Financial Resources/Insurance Housing Physical Health Resilience  ADL's:  Intact  Cognition:  WNL  Sleep:  Number of Hours: 6.3     Treatment Plan Summary: Daily contact with patient to assess and evaluate symptoms and progress in treatment and Medication management   Mr. Cragg is a 17 year old male with a history of mood instability and self injurious behavior admitted for repeated suicide attempts while in jail.  #Agitation, resolved -Saphris 10 mg SL TID available for agitation, anxiety and sleep  #Mood -lower Seroquel to 50 mg nightly -discontinue Trazodone -discontinue Clonidine 0.05 mg BID -discontinue Clonazepam 0.5 mg BID -start Xanax 1 mg BID  #Status post Ross procedure -Cardiac ECHO -EKG if any episodes of   #Urinary retention -self cath  #Labs -lipid panel, TSH, A1C  #Social -incompetent adult -[facility] is the guardian  #Disposition -he will return to his group home -needs follow up for mental health, cardiology, urology  Kristine Linea, Christian Alexander 06/09/2018, 10:58 AM

## 2018-06-09 NOTE — Progress Notes (Signed)
LCSW just received a phone call from Guardian this patient inserts small objects into his penis, needs to be watched. In previous facility he has kicked down solid metal doors. She is requesting we make a referral to [facility]. Patient is un predictable. There is a 60 day written notice to his group home ( its not up yet) so he will return if needed and [employer] is to have 2- 1-1 one workers with him at the group home at all times.   LCSW advised patient nurse and charge and paged Dr Demetrius Charity to advise her of potential.  Arrie Senate LCSW [phone]

## 2018-06-09 NOTE — [facility] Group Notes (Signed)
LCSW Group Therapy Note  06/09/2018 1:15pm  Type of Therapy and Topic:  Group Therapy:  Healthy Self Image and Positive Change  Participation Level:  Active   Description of Group:  In this group, patients will compare and contrast their current "I am...." statements to the visions they identify as desirable for their lives.  Patients discuss fears and how they can make positive changes in their cognitions that will positively impact their behaviors.  Facilitator played a motivational 3-minute speech and patients were left with the task of thinking about what "I am...." statements they can start using in their lives immediately.  Therapeutic Goals: 1. Patient will state their current self-perception as expressed in an "I Am" statement 2. Patient will contrast this with their desired vision for their live 3. Patient will identify 3 fears that negatively impact their behavior 4. Patient will discuss cognitive distortions that stem from their fears 5. Patient will verbalize statements that challenge their cognitive distortions  Summary of Patient Progress:  The patient reported that he feels "depressed." The patient stated, "I am street smart" Patient discussed his fears and how he can make positive changes in their cognitions that will positively impact his behaviors. Patient was able to discuss and process cognitive distortions that stem from his fears. Patient actively and appropriately engaged in the group. Patient was able to provide support and validation to other group members. Patient practiced active listening when interacting with the facilitator and other group members.    Therapeutic Modalities Cognitive Behavioral Therapy Motivational Interviewing  Kadey Mihalic  CUEBAS-COLON, LCSW 06/09/2018 12:42 PM

## 2018-06-09 NOTE — Progress Notes (Signed)
Made multiple attempts to contact supply chain for patients 22fr in-and-out caths. Was able to reach anyone.

## 2018-06-09 NOTE — Plan of Care (Signed)
D: Patient denies SI/HI/AVH. Patient is calm, cooperative and pleasant. Patient is seen in milieu interacting with peers. Patient has no complaints at this time. Patient did not complete a self inventory sheet today. Patient is appropriate during assessment.   A: Patient was assessed by this nurse. Patient was oriented to unit. Patient's safety was maintained on unit. Q x 15 minute observation checks were completed for safety. Patient care plan was reviewed. Patient was offered support and encouragement. Patient was encourage to attend groups, participate in unit activities and continue with plan of care.    R: Patient has no complaints of pain at this time. Patient is receptive to treatment and safety maintained on unit.     Problem: Education: Goal: Knowledge of [facility] Education information/materials will improve Outcome: Progressing Goal: Mental status will improve Outcome: Progressing   Problem: Self-Concept: Goal: Will verbalize positive feelings about self Outcome: Progressing

## 2018-06-09 NOTE — Progress Notes (Signed)
[facility] MD Progress Note  06/10/2018 11:27 AM Mylan Schwarz  MRN:  [id number]  Subjective:    Mr. Stangl has no complaints today. Yesterday he had an almost fainting episode that he associates with low heart rate. EKG was normal. Awaiting ECHO as a follow up for Ross procedure in 2016. He woke up once lst night with SOB. Tolerates medications well. He found Saphris helpful but with nasty taste. He is asking to start low dose Trileptal.  We spoke with his guardian's agency to confirm that no court appearance necessary tomorrow.   Principal Problem: Bipolar I disorder, most recent episode depressed, severe without psychotic features ([place]) Diagnosis:   Patient Active Problem List   Diagnosis Date Noted  . Bipolar I disorder, most recent episode depressed, severe without psychotic features ([place]) [F31.4] 06/08/2018    Priority: High  . 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 Bipolar 1 disorder, depressed, moderate ([place]) [F31.32] 08/15/2014  . 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 ODD (oppositional defiant disorder) [F91.3] 08/15/2014  . 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 Homicidal ideation [R45.850]   . Suicidal ideation [R45.851]    Total Time spent with patient: 20 minutes  Past Psychiatric History: bipolar disorder  Past Medical History:  Past Medical History:  Diagnosis Date  . Aortic valve stenosis   . Attention deficit disorder (ADD)   . ODD (oppositional defiant disorder)     Past Surgical History:  Procedure Laterality Date  . CARDIAC SURGERY     Family History: History reviewed. No pertinent family history. Family Psychiatric  History: father with mental illness Social History:  Social History   Substance and Sexual Activity  Alcohol Use No     Social History   Substance and Sexual Activity  Drug Use  Yes  . Types: Marijuana    Social History   Socioeconomic History  . Marital status: Single    Spouse name: Not on file  . Number of children: Not on file  . Years of education: Not on file  . Highest education level: Not on file  Occupational History  . Not on file  Social Needs  . Financial resource strain: Not on file  . Food insecurity:    Worry: Not on file    Inability: Not on file  . Transportation needs:    Medical: Not on file    Non-medical: Not on file  Tobacco Use  . Smoking status: Current Some Day Smoker    Packs/day: 1.00    Types: Cigarettes  . Smokeless tobacco: Never Used  Substance and Sexual Activity  . Alcohol use: No  . Drug use: Yes    Types: Marijuana  . Sexual activity: Never  Lifestyle  . Physical activity:    Days per week: Not on file    Minutes per session: Not on file  . Stress: Not on file  Relationships  . Social connections:    Talks on phone: Not on file    Gets together: Not on file    Attends religious service: Not on file    Active member of club or organization: Not on file    Attends meetings of clubs or organizations: Not on file    Relationship status: Not on file  Other Topics Concern  . Not on file  Social History Narrative  . Not on file   Additional Social History:  Sleep: Fair  Appetite:  Fair  Current Medications: Current Facility-Administered Medications  Medication Dose Route Frequency Provider Last Rate Last Dose  . acetaminophen (TYLENOL) tablet 650 mg  650 mg Oral Q6H PRN Clapacs, John T, MD      . ALPRAZolam Duanne Moron) tablet 1 mg  1 mg Oral BID Najeh Credit B, MD   1 mg at 06/10/18 6294  . alum & mag hydroxide-simeth (MAALOX/MYLANTA) 200-200-20 MG/5ML suspension 30 mL  30 mL Oral Q4H PRN Clapacs, John T, MD      . asenapine (SAPHRIS) sublingual tablet 5 mg  5 mg Sublingual TID PRN Ahtziri Jeffries B, MD      . magnesium hydroxide (MILK OF MAGNESIA) suspension 30  mL  30 mL Oral Daily PRN Clapacs, John T, MD      . nicotine polacrilex (NICORETTE) gum 2 mg  2 mg Oral PRN Deavion Strider B, MD   2 mg at 06/09/18 1116  . OXcarbazepine (TRILEPTAL) tablet 150 mg  150 mg Oral BID Aris Moman B, MD      . QUEtiapine (SEROQUEL) tablet 50 mg  50 mg Oral QHS Dashon Mcintire B, MD   50 mg at 06/09/18 2108  . tamsulosin (FLOMAX) capsule 0.4 mg  0.4 mg Oral QPC breakfast Avish Torry B, MD   0.4 mg at 06/10/18 7654    Lab Results:  Results for orders placed or performed during the hospital encounter of 06/08/18 (from the past 48 hour(s))  Hemoglobin A1c     Status: Abnormal   Collection Time: 06/09/18  7:17 AM  Result Value Ref Range   Hgb A1c MFr Bld 4.5 (L) 4.8 - 5.6 %    Comment: (NOTE) Pre diabetes:          5.7%-6.4% Diabetes:              >6.4% Glycemic control for   <7.0% adults with diabetes    Mean Plasma Glucose 82.45 mg/dL    Comment: Performed at [facility], [place] [address] [place], [place] [address]  Lipid panel     Status: None   Collection Time: 06/09/18  7:17 AM  Result Value Ref Range   Cholesterol 140 0 - 200 mg/dL   Triglycerides 50 <150 mg/dL   HDL 59 >40 mg/dL   Total CHOL/HDL Ratio 2.4 RATIO   VLDL 10 0 - 40 mg/dL   LDL Cholesterol 71 0 - 99 mg/dL    Comment:        Total Cholesterol/HDL:CHD Risk Coronary Heart Disease Risk Table                     Men   Women  1/2 Average Risk   3.4   3.3  Average Risk       5.0   4.4  2 X Average Risk   9.6   7.1  3 X Average Risk  23.4   11.0        Use the calculated Patient Ratio above and the CHD Risk Table to determine the patient's CHD Risk.        ATP III CLASSIFICATION (LDL):  <100     mg/dL   Optimal  100-129  mg/dL   Near or Above                    Optimal  130-159  mg/dL   Borderline  160-189  mg/dL   High  >190  mg/dL   Very High Performed at [facility], [address] [place], [place] [address]   TSH     Status: None    Collection Time: 06/09/18  7:17 AM  Result Value Ref Range   TSH 2.179 0.350 - 4.500 uIU/mL    Comment: Performed by a 3rd Generation assay with a functional sensitivity of <=0.01 uIU/mL. Performed at [facility], [address] [place], [place] [address]     Blood Alcohol level:  Lab Results  Component Value Date   [facility] <10 06/06/2018   ETH <5 01/77/9390    Metabolic Disorder Labs: Lab Results  Component Value Date   HGBA1C 4.5 (L) 06/09/2018   MPG 82.45 06/09/2018   MPG 100 08/16/2014   No results found for: PROLACTIN Lab Results  Component Value Date   CHOL 140 06/09/2018   TRIG 50 06/09/2018   HDL 59 06/09/2018   CHOLHDL 2.4 06/09/2018   VLDL 10 06/09/2018   LDLCALC 71 06/09/2018   LDLCALC 57 08/16/2014    Physical Findings: AIMS:  , ,  ,  ,    CIWA:    COWS:     Musculoskeletal: Strength & Muscle Tone: within normal limits Gait & Station: normal Patient leans: N/A  Psychiatric Specialty Exam: Physical Exam  Nursing note and vitals reviewed. Eyes: Pupils are equal, round, and reactive to light. Conjunctivae are normal.  GI: Bowel sounds are normal.  Psychiatric: He has a normal mood and affect. His speech is normal and behavior is normal. Thought content normal. Cognition and memory are normal. He expresses impulsivity.    Review of Systems  Neurological: Negative.   Psychiatric/Behavioral: Negative.   All other systems reviewed and are negative.   Blood pressure 108/70, pulse 96, temperature 98.4 F (36.9 C), temperature source Oral, resp. rate 16, height 5' 2.01" (1.575 m), weight 49 kg, SpO2 99 %.Body mass index is 19.75 kg/m.  General Appearance: Casual  Eye Contact:  Good  Speech:  Clear and Coherent  Volume:  Normal  Mood:  Anxious  Affect:  Appropriate  Thought Process:  Goal Directed and Descriptions of Associations: Intact  Orientation:  Full (Time, Place, and Person)  Thought Content:  WDL  Suicidal Thoughts:  No  Homicidal  Thoughts:  No  Memory:  Immediate;   Fair Recent;   Fair Remote;   Fair  Judgement:  Poor  Insight:  Lacking  Psychomotor Activity:  Normal  Concentration:  Concentration: Fair and Attention Span: Fair  Recall:  [employer] of Knowledge:  Fair  Language:  Fair  Akathisia:  No  Handed:  Right  AIMS (if indicated):     Assets:  Communication Skills Desire for Improvement Financial Resources/Insurance Housing Resilience  ADL's:  Intact  Cognition:  WNL  Sleep:  Number of Hours: 6.45     Treatment Plan Summary: Daily contact with patient to assess and evaluate symptoms and progress in treatment and Medication management   Mr. Davalos is a 17 year old male with a history of mood instability and self injurious behavior admitted for repeated suicide attempts while in jail.  #Agitation, resolved -lower Sa[phris to 5 mg TID available for agitation, anxiety and sleep  #Mood -continue Seroquel to 50 mg nightly -continue Xanax 1 mg BID -start Trileptal 150 mg BID  #Status post Ross procedure -Cardiac ECHO -EKG if any episodes of   #Urinary retention -self cath  #Labs -lipid panel, TSH, A1C  #Social -incompetent adult -[place] is  the guardian  #Disposition -he will return to his group home -needs follow up for mental health, cardiology, urology  Orson Slick, MD 06/10/2018, 11:27 AM

## 2018-06-09 NOTE — Plan of Care (Signed)
Patient is calm and aware of his cognitive conditions and coping mechanisms , adjusting adequately in the unit, socializing with peers and participating in group scheduled activities, compliant with his medications and sleeping long hours with out any interruptions, contract for safety and monitored every 15 minute rounding no distress Problem: Education: Goal: Knowledge of [facility] Education information/materials will improve Outcome: Progressing Goal: Emotional status will improve Outcome: Progressing Goal: Mental status will improve Outcome: Progressing Goal: Verbalization of understanding the information provided will improve Outcome: Progressing   Problem: Coping: Goal: Ability to interact with others will improve Outcome: Progressing Goal: Demonstration of participation in decision-making regarding own care will improve Outcome: Progressing   Problem: Self-Concept: Goal: Will verbalize positive feelings about self Outcome: Progressing   Problem: Activity: Goal: Interest or engagement in leisure activities will improve Outcome: Progressing

## 2018-06-09 NOTE — Progress Notes (Signed)
LCSW placed consent forms in patient chart, received verbal instruction from his guardian.  [employer] LCSW [phone]
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
## 2018-06-09 NOTE — Progress Notes (Signed)
Patient had reported unwitnessed fall at approx. 1340. Patient was able to press call bell and alert staff of reported fall. Patient was found sitting on the group near call bell in room 312. Patient reports no injury. Patient reports that he had left group during this time when he felt light headed and returned to room. Patient reported that he fell due to light headedness. Patient was assessed. No obvious signs of injury, patient was alter and oriented. Patient neuro check was WDL. Patient was PERLA. Patient was able to be returned to bed. AC and Dr. Demetrius Charity contacted and made aware. Orders for continued observation, and EKG were obtained. Patient guardian was contacted and made aware. Patient will have fall status changed to High fall risk and will be follow via the post fall process.

## 2018-06-09 NOTE — Progress Notes (Addendum)
LCSW completed assessment for patient and called guardian. Able to update and to put information in BMU handoff.  [employer] LCSW [phone]

## 2018-06-10 ENCOUNTER — Inpatient Hospital Stay: Admission: AD | Admit: 2018-06-10 | Payer: Medicaid Other | Admitting: Psychiatry

## 2018-06-10 DIAGNOSIS — F314 Bipolar disorder, current episode depressed, severe, without psychotic features: Principal | ICD-10-CM

## 2018-06-10 MED ORDER — OXCARBAZEPINE 150 MG PO TABS
150.0000 mg | ORAL_TABLET | Freq: Two times a day (BID) | ORAL | Status: DC
  Administered 2018-06-10 – 2018-06-14 (×9): 150 mg via ORAL
  Filled 2018-06-10 (×10): qty 1

## 2018-06-10 MED ORDER — LORAZEPAM 2 MG PO TABS
2.0000 mg | ORAL_TABLET | ORAL | Status: DC | PRN
  Administered 2018-06-10 – 2018-06-13 (×5): 2 mg via ORAL
  Filled 2018-06-10 (×5): qty 1

## 2018-06-10 MED ORDER — HALOPERIDOL 5 MG PO TABS
10.0000 mg | ORAL_TABLET | Freq: Four times a day (QID) | ORAL | Status: DC | PRN
  Administered 2018-06-10 – 2018-06-15 (×3): 10 mg via ORAL
  Filled 2018-06-10 (×3): qty 2

## 2018-06-10 MED ORDER — DIPHENHYDRAMINE HCL 25 MG PO CAPS
50.0000 mg | ORAL_CAPSULE | Freq: Four times a day (QID) | ORAL | Status: DC | PRN
  Administered 2018-06-10: 50 mg via ORAL
  Filled 2018-06-10: qty 2

## 2018-06-10 NOTE — Progress Notes (Signed)
Spoke with Dr. Marlou Porch @ approx. [phone]. Plan is to continue to observer patient with 1:1 and visualize patient ability to urinate. Patient will follow up in urology clinic tomorrow at [facility] to have foreign body removed. [facility] AC and [facility] AC made aware.

## 2018-06-10 NOTE — Progress Notes (Signed)
Called by MD that patient had passed drinking straw into his penis.  H/o these type of maneuvers in the past, evidently had been given a catheter to keep his activity safe.  According to the nurse, there was urine in the toilet suggesting that he was able to void.  Plan to leave object in and have the patient wheeled to the urology clinic tomorrow during business hours to have the straw removed.

## 2018-06-10 NOTE — Progress Notes (Addendum)
At approx. 1820 patient came to this Clinical research associate and was asked to discuss something in his room. Patient then revealed that he had put a straw inside of his penis and it was "stuck." This Clinical research associate obtained gloves and another nurse. Patient's penis was examined and a hard object was felt from mid shaft to the base of the penis. Urine was noted in the toilet. Patient was immediatly placed on 1:1 for safety and provided Dr. Jennet Maduro was contacted at 1823. [facility] AC and [facility] AC contacted and made aware. Will continue to monitor patient.

## 2018-06-10 NOTE — Progress Notes (Addendum)
Progress Note Related to Urination  @2250 , patient reports that his bladder and penis are painful 10/10. Patient remains unable to urinate since putting drinking straw into his penis. Dr. Jennet Maduro is contacted. Bladder scanned @2300  >572cc. Dr. Demetrius Charity notified.  @2340 , Dr. Marlou Porch calls and tells RN to tell the patient to continue to attempt to urinate on his own.  Patient is instructed to continue to try to urinate on his own. Given Tylenol for pain. Will monitor for efficacy. Patient asleep @0000 . @0520 , patient is able to urinate "a little bit" into the toilet on his own. Reports that he urinated enough that his bladder does not hurt anymore.

## 2018-06-10 NOTE — Plan of Care (Signed)
Patient denies SI/HI/AVH. Patient is cooperative during assessment. Patient reports 0/10 depression and 0/10 anxiety. Patient is seen in milieu with peers and participating in the group room. Patient voices mild agitation and is commutative with staff about issues. Patient is provided with self-cath 23fr straight. Patient is provided with scheduled medications. Patient's safety is maintained on the unit.    Problem: Education: Goal: Knowledge of [place] General Education information/materials will improve Outcome: Progressing Goal: Emotional status will improve Outcome: Progressing   Problem: Coping: Goal: Ability to interact with others will improve Outcome: Progressing   Problem: Self-Concept: Goal: Will verbalize positive feelings about self Outcome: Progressing   Problem: Activity: Goal: Interest or engagement in leisure activities will improve Outcome: Progressing   Problem: Safety: Goal: Ability to remain free from injury will improve Outcome: Progressing

## 2018-06-10 NOTE — Progress Notes (Addendum)
Patient is found on the phone upon my arrival. Patient is currently on 1:1 sitter for earlier incident involving his putting a drinking straw in his penis. 1:1 sitter is with patient as he is calm and compliant when he suddenly uses a patient safety pen to scratch himself and makes a 1 inch scratch that draws blood on his LFA. Sitter brings patient to Charity fundraiser. Wound is washed and bandaged. While speaking with the patient, he states he has "many things" in his room that he is able to self-harm with. RN instructs staff to take everything out of his room. Patient told another RN that he would take medication PO if offered something to calm down. Dr. Jennet Maduro contacted and patient is given Haldol, Ativan and Benadryl PO.  As staff is going through his room, patient becomes escalated. Patient then postures towards staff and threatens to "hurt anyone who tries to come in my room." Once more staff arrives, patient behavior becomes even more escalated. Patient then attempts to cut himself with his arm bands and refuses to allow RN to cut them off. "If you touch me, I will put you through the wall." Patient is unsuccessful at cutting himself with the band but then begins to dig into his new wound with his fingernails. As staff approach, patient continues to threaten and begins to spit at staff. Patient required physical intervention and was placed in restraint chair. Patient fought throughout the process. Kicking, scratching, spitting, biting, and cursing at staff for the several minutes it required to secure the patient in the chair. Patient continued to threaten staff after being secured in the chair. Patient spit on several staff and spit mask was obtained from ED. Patient continued to attempt to remove himself from restraint chair and continues to threaten staff regarding his hurting everyone when he is allowed out of the chair. Prior to being released, patient is questioned as to how staff could avoid doing this in the  future. Patient states that his group home gives him Geodon 20mg  IM as per his request. He states that he asks for it when he feels "one of my episodes" coming on. "Before I get mad."  Patient placed in chair @2100 . @2200 , patient states that he is "feeling the medication" and reports he will not fight. Reports being sorry and that he wants to urinate. 1:1 sitter attempts to help patient urinate but patient is unable to urinate on his own. Dr. Hinton Dyer notified of patient's response to restraint. Geodon 20mg  IM BID PRN ordered.  @2215 , patient is released from restraint. Contracts for safety. No signs of injury sustained. Debriefing with patient and staff completed. Remains on 1:1 sitter. @0520 , patient awake. Requests Geodon IM for anxiety/agitation. States, "I need it for my nerves." Geodon IM administered without incident. Will continue to monitor. Patient slept 4.75 hours. @0520 , patient reports that he was able to urinate "a little bit" into the toilet. Patient reports pain is relieved by this.

## 2018-06-10 NOTE — Progress Notes (Signed)
Patient ID: Christian Alexander, male   DOB: 29-Sep-2000, 17 y.o.   MRN: [id number]  [name] inserted a straw in his urethra this afte noon. Urology notified.   In spite of 1:1 sitter, the patient continued with self injurious behaviors cutting himself with a pen, then with his arm band that he would not allow [place] to remove. We added security guard. The patient agreed to take Haldol, Benadryl and Ativan by mouth but still continued with unsafe behaviors.  The patient was threatening to hurt himself and staff. Order for restraint chair for safety was entered. PRN medications are available.

## 2018-06-10 NOTE — [facility] Group Notes (Signed)
LCSW Group Therapy Note 06/10/2018 1:15pm  Type of Therapy and Topic: Group Therapy: Feelings Around Returning Home & Establishing a Supportive Framework and Supporting Oneself When Supports Not Available  Participation Level: Did Not Attend  Description of Group:  Patients first processed thoughts and feelings about upcoming discharge. These included fears of upcoming changes, lack of change, new living environments, judgements and expectations from others and overall stigma of mental health issues. The group then discussed the definition of a supportive framework, what that looks and feels like, and how do to discern it from an unhealthy non-supportive network. The group identified different types of supports as well as what to do when your family/friends are less than helpful or unavailable  Therapeutic Goals  1. Patient will identify one healthy supportive network that they can use at discharge. 2. Patient will identify one factor of a supportive framework and how to tell it from an unhealthy network. 3. Patient able to identify one coping skill to use when they do not have positive supports from others. 4. Patient will demonstrate ability to communicate their needs through discussion and/or role plays.  Summary of Patient Progress:  Pt was invited to attend group but chose not to attend. CSW will continue to encourage pt to attend group throughout their admission.   Therapeutic Modalities Cognitive Behavioral Therapy Motivational Interviewing   Payten Beaumier  CUEBAS-COLON, LCSW 06/10/2018 12:09 PM  

## 2018-06-10 NOTE — Progress Notes (Addendum)
Post Seclusion/Restraint Episode Mini Treatment Team  Date of Seclusion or Restraint Episode:  06/10/2018 Today's Date:  06/10/2018  List of Patient Triggers/Skill Deficits:Anger regarding his court date he says he has tomorrow and the $350 bond that he will lose if he does not show up. (This is what the patient stated caused his outburst during debrief).   Review of Medications:  Current Facility-Administered Medications  Medication Dose Route Frequency Provider Last Rate Last Dose  . acetaminophen (TYLENOL) tablet 650 mg  650 mg Oral Q6H PRN Clapacs, John T, MD      . ALPRAZolam Prudy Feeler) tablet 1 mg  1 mg Oral BID Pucilowska, Jolanta B, MD   1 mg at 06/10/18 1619  . alum & mag hydroxide-simeth (MAALOX/MYLANTA) 200-200-20 MG/5ML suspension 30 mL  30 mL Oral Q4H PRN Clapacs, John T, MD      . asenapine (SAPHRIS) sublingual tablet 5 mg  5 mg Sublingual TID PRN Pucilowska, Jolanta B, MD   5 mg at 06/10/18 1223  . diphenhydrAMINE (BENADRYL) capsule 50 mg  50 mg Oral Q6H PRN Pucilowska, Jolanta B, MD   50 mg at 06/10/18 2034  . haloperidol (HALDOL) tablet 10 mg  10 mg Oral Q6H PRN Pucilowska, Jolanta B, MD   10 mg at 06/10/18 2034  . LORazepam (ATIVAN) tablet 2 mg  2 mg Oral Q4H PRN Pucilowska, Jolanta B, MD   2 mg at 06/10/18 2034  . magnesium hydroxide (MILK OF MAGNESIA) suspension 30 mL  30 mL Oral Daily PRN Clapacs, John T, MD      . nicotine polacrilex (NICORETTE) gum 2 mg  2 mg Oral PRN Pucilowska, Jolanta B, MD   2 mg at 06/09/18 1116  . OXcarbazepine (TRILEPTAL) tablet 150 mg  150 mg Oral BID Pucilowska, Jolanta B, MD   150 mg at 06/10/18 1713  . QUEtiapine (SEROQUEL) tablet 50 mg  50 mg Oral QHS Pucilowska, Jolanta B, MD   50 mg at 06/10/18 2034  . tamsulosin (FLOMAX) capsule 0.4 mg  0.4 mg Oral QPC breakfast Pucilowska, Jolanta B, MD   0.4 mg at 06/10/18 1610    Compliant with Medications:  Yes  Need for Medication Adjustment:  Yes  Plan to Prevent Future Episodes of Seclusion and  Restraint: Patient reports he needs to have 20mg  IM Geodon PRN for avoiding his angry/self-harm/threatening behaviors.  Staff Present:  Physician Pucilowska  Nurse Practitioner/PA   Pharmacist   Nurse Dell Ponto  Nurse Arsenio Loader  Nurse   MHT/NT Lance Bosch  Counselor/Case Manager   Department Leadership        Ernst Breach Catrina Fellenz 06/10/2018, 10:28 PM

## 2018-06-10 NOTE — Plan of Care (Signed)
Patient is progressing nicely in the unit, patient is aware of coping skills and able to voice and identify positive attribute of self. Voice no complain, and compliant with his medications, contract for safety of self and others, educate patient to stop forcing materials in his pennis , understood information provided  . Patient is socializing well with peers. Patient denies SI/HI , patient sleeps through the night and only requiring routing visual checks no distress.    Problem: Education: Goal: Knowledge of [place] General Education information/materials will improve Outcome: Progressing Goal: Emotional status will improve Outcome: Progressing Goal: Mental status will improve Outcome: Progressing Goal: Verbalization of understanding the information provided will improve Outcome: Progressing   Problem: Coping: Goal: Ability to interact with others will improve Outcome: Progressing Goal: Demonstration of participation in decision-making regarding own care will improve Outcome: Progressing   Problem: Self-Concept: Goal: Will verbalize positive feelings about self Outcome: Progressing   Problem: Activity: Goal: Interest or engagement in leisure activities will improve Outcome: Progressing   Problem: Education: Goal: Knowledge of General Education information will improve Description Including pain rating scale, medication(s)/side effects and non-pharmacologic comfort measures Outcome: Progressing   Problem: Health Behavior/Discharge Planning: Goal: Ability to manage health-related needs will improve Outcome: Progressing   Problem: Clinical Measurements: Goal: Ability to maintain clinical measurements within normal limits will improve Outcome: Progressing Goal: Will remain free from infection Outcome: Progressing Goal: Diagnostic test results will improve Outcome: Progressing Goal: Respiratory complications will improve Outcome: Progressing Goal: Cardiovascular  complication will be avoided Outcome: Progressing   Problem: Activity: Goal: Risk for activity intolerance will decrease Outcome: Progressing   Problem: Nutrition: Goal: Adequate nutrition will be maintained Outcome: Progressing   Problem: Coping: Goal: Level of anxiety will decrease Outcome: Progressing   Problem: Elimination: Goal: Will not experience complications related to bowel motility Outcome: Progressing Goal: Will not experience complications related to urinary retention Outcome: Progressing   Problem: Pain Managment: Goal: General experience of comfort will improve Outcome: Progressing   Problem: Safety: Goal: Ability to remain free from injury will improve Outcome: Progressing   Problem: Skin Integrity: Goal: Risk for impaired skin integrity will decrease Outcome: Progressing

## 2018-06-11 ENCOUNTER — Ambulatory Visit (INDEPENDENT_AMBULATORY_CARE_PROVIDER_SITE_OTHER): Payer: Medicaid Other | Admitting: Urology

## 2018-06-11 ENCOUNTER — Inpatient Hospital Stay (HOSPITAL_COMMUNITY)
Admission: AD | Admit: 2018-06-11 | Discharge: 2018-06-11 | Disposition: A | Discharge status: Home / Self Care | Payer: Medicaid Other | Attending: Psychiatry | Admitting: Psychiatry

## 2018-06-11 ENCOUNTER — Telehealth: Payer: Self-pay | Admitting: Urology

## 2018-06-11 DIAGNOSIS — T190XXA Foreign body in urethra, initial encounter: Secondary | ICD-10-CM

## 2018-06-11 DIAGNOSIS — I361 Nonrheumatic tricuspid (valve) insufficiency: Secondary | ICD-10-CM

## 2018-06-11 MED ORDER — ZIPRASIDONE MESYLATE 20 MG IM SOLR
20.0000 mg | Freq: Two times a day (BID) | INTRAMUSCULAR | Status: DC | PRN
  Administered 2018-06-11 – 2018-06-20 (×17): 20 mg via INTRAMUSCULAR
  Filled 2018-06-11 (×20): qty 20

## 2018-06-11 MED ORDER — TRAMADOL HCL 50 MG PO TABS
50.0000 mg | ORAL_TABLET | Freq: Four times a day (QID) | ORAL | Status: DC | PRN
  Administered 2018-06-11: 50 mg via ORAL
  Filled 2018-06-11 (×2): qty 1
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
## 2018-06-11 NOTE — Plan of Care (Signed)
Patient was anxious this morning ,asked for medications.Ativan given.Patient went to Urology this morning for foreign body removal.Patient tolerated the procedure.Patient urinated without any difficulty.No aggressive or self harm behaviors noted today.Denies SI,HI and AVH.Compliant with medications.Appetite and energy level good.Support and encouragement given.

## 2018-06-11 NOTE — Telephone Encounter (Signed)
-----   Message from Crist Fat, MD sent at 06/10/2018  6:59 PM EDT ----- Regarding: office cysto on Monday Michelle -  Please add this patient to clinic on Monday for cystoscopy and removal of his foreign body.  He is on [facility] patient and current in room 312.  Once he's scheduled, please call the ward clerk secretary at [phone] to let them know when the appointment is so that he can be wheeled over and be there on time. Thanks, bh

## 2018-06-11 NOTE — Progress Notes (Signed)
Recreation Therapy Notes  INPATIENT RECREATION THERAPY ASSESSMENT  Patient Details Name: Christian Alexander MRN: [id number] DOB: [birth year] Today's Date: 06/11/2018       Information Obtained From: (Patient unable to complete assessment at this time)  Able to Participate in Assessment/Interview:    Patient Presentation:    Reason for Admission (Per Patient):    Patient Stressors:    Coping Skills:      Leisure Interests (2+):     Frequency of Recreation/Participation:    Awareness of Community Resources:     [employer]:     Current Use:    If no, Barriers?:    Expressed Interest in [employer] Information:    [place] of Residence:     Patient Main Form of Transportation:    Patient Strengths:     Patient Identified Areas of Improvement:     Patient Goal for Hospitalization:     Current SI (including self-harm):     Current HI:     Current AVH:    Staff Intervention Plan:    Consent to Intern Participation:    Christian Alexander 06/11/2018, 3:18 PM

## 2018-06-11 NOTE — Progress Notes (Signed)
14:00  Patient watching TV in the day room.Sitter with patient.  15;00  Patient appropriate behavior with 1:1.  16;00  Patient is having logical and coherent conversation with staff.  17:00 Patient appropriate behavior with 1:1.  18:00 Patient watching TV.Sitter with patient.

## 2018-06-11 NOTE — Progress Notes (Signed)
08:00  Patient asked medication for anxiety.PRN Ativan given.Patient denies SI,HI and AVH at this time.Sitter with patient.  09;00  Patient os off the floor for urology and Echo.  10:00  Patient back to floor.C/O pain on his chest area.VS stable.Pain medications given.Sitter with patient.  11:00  Patient sitting in the hallway.Sitter with safety.  12;00  Patient appropriate behavior with 1:1.  13:00 Patient appropriate behavior with 1:1.                                     00

## 2018-06-11 NOTE — Telephone Encounter (Signed)
App made °MB °

## 2018-06-11 NOTE — Progress Notes (Signed)
Cystoscopy Procedure Note:  Indication: Urethral foreign body.  During prep for cystoscopy, nurse noted a small piece of rolled up plastic that fell out of the urethra.  After informed consent and discussion of the procedure and its risks, Christian Alexander was positioned and prepped in the standard fashion. Cystoscopy was performed with a flexible cystoscope. The urethra, bladder neck and entire bladder was visualized in a standard fashion.  There were no foreign bodies remaining.  The prostate was small. The ureteral orifices were visualized in their normal location and orientation.  Bladder mucosa grossly normal.  Findings: Normal cystoscopy, no residual foreign bodies  Assessment and Plan: Okay for discharge from urology perspective, we discussed at length the risks of continuing to place foreign bodies in the urethra including stricture and infection  Follow-up as needed  Legrand Rams, MD 06/11/2018

## 2018-06-11 NOTE — Progress Notes (Addendum)
[facility] MD Progress Note  06/12/2018 3:07 PM Nihar Klus  MRN:  [id number]  Subjective:   There were no unwanted events since his restraint. He hss 2:1 sitters, including security guard, and most of the time is isolated from peers. He eats his meals in his room and watches his own TV. For the first time today, he went outside with his security detail and played basketball with peers, no problems. But he is a ticking bomb.   He asks for Geodon IM injection twice daily when in distress. It does calm him down. A combination of Benadryl and Thorazine IM was used with some success at [facility] but Benadryl causes urinary retention, according to the patient. In any case, this is not practical in the community.   I am negotiating with our pharmacy if a trial of inhaled Loxapine is possible. It can cause bronchospasm and the facility has to be registered with [facility]ox[facility]. I believe that it would be worth a trial.   Spoke with the guardian. She talked to there patient yesterday and was toled that "the voices" make him hurt himself. He denies voices to [place].    Principal Problem: Bipolar I disorder, most recent episode depressed, severe without psychotic features (HCC) Diagnosis:   Patient Active Problem List   Diagnosis Date Noted  . Bipolar I disorder, most recent episode depressed, severe without psychotic features (HCC) [F31.4] 06/08/2018    Priority: High  . 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 Bipolar 1 disorder, depressed, moderate (HCC) [F31.32] 08/15/2014  . 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 ODD (oppositional defiant disorder) [F91.3] 08/15/2014  . 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 Homicidal ideation [R45.850]   . Suicidal ideation [R45.851]    Total Time spent with patient: 20 minutes  Past Psychiatric History: bipolar  disorder  Past Medical History:  Past Medical History:  Diagnosis Date  . Aortic valve stenosis   . Attention deficit disorder (ADD)   . ODD (oppositional defiant disorder)     Past Surgical History:  Procedure Laterality Date  . CARDIAC SURGERY     Family History: History reviewed. No pertinent family history. Family Psychiatric  History: father with mental illness Social History:  Social History   Substance and Sexual Activity  Alcohol Use No     Social History   Substance and Sexual Activity  Drug Use Yes  . Types: Marijuana    Social History   Socioeconomic History  . Marital status: Single    Spouse name: Not on file  . Number of children: Not on file  . Years of education: Not on file  . Highest education level: Not on file  Occupational History  . Not on file  Social Needs  . Financial resource strain: Not on file  . Food insecurity:    Worry: Not on file    Inability: Not on file  . Transportation needs:    Medical: Not on file    Non-medical: Not on file  Tobacco Use  . Smoking status: Current Some Day Smoker    Packs/day: 1.00    Types: Cigarettes  . Smokeless tobacco: Never Used  Substance and Sexual Activity  . Alcohol use: No  . Drug use: Yes    Types: Marijuana  . Sexual activity: Never  Lifestyle  . Physical activity:    Days per week: Not on file  Minutes per session: Not on file  . Stress: Not on file  Relationships  . Social connections:    Talks on phone: Not on file    Gets together: Not on file    Attends religious service: Not on file    Active member of club or organization: Not on file    Attends meetings of clubs or organizations: Not on file    Relationship status: Not on file  Other Topics Concern  . Not on file  Social History Narrative  . Not on file   Additional Social History:                         Sleep: Fair  Appetite:  Fair  Current Medications: Current Facility-Administered Medications   Medication Dose Route Frequency Provider Last Rate Last Dose  . acetaminophen (TYLENOL) tablet 650 mg  650 mg Oral Q6H PRN Clapacs, Jackquline Denmark, MD   650 mg at 06/10/18 2256  . ALPRAZolam (XANAX) tablet 1 mg  1 mg Oral BID Jalena Vanderlinden B, MD   1 mg at 06/12/18 0754  . alum & mag hydroxide-simeth (MAALOX/MYLANTA) 200-200-20 MG/5ML suspension 30 mL  30 mL Oral Q4H PRN Clapacs, John T, MD      . diphenhydrAMINE (BENADRYL) capsule 50 mg  50 mg Oral Q6H PRN Ajayla Iglesias B, MD   50 mg at 06/10/18 2034  . haloperidol (HALDOL) tablet 10 mg  10 mg Oral Q6H PRN Yolunda Kloos B, MD   10 mg at 06/10/18 2034  . LORazepam (ATIVAN) tablet 2 mg  2 mg Oral Q4H PRN Teighan Aubert B, MD   2 mg at 06/12/18 0130  . magnesium hydroxide (MILK OF MAGNESIA) suspension 30 mL  30 mL Oral Daily PRN Clapacs, John T, MD      . nicotine polacrilex (NICORETTE) gum 2 mg  2 mg Oral PRN Yolonda Purtle B, MD   2 mg at 06/11/18 1400  . OXcarbazepine (TRILEPTAL) tablet 150 mg  150 mg Oral BID Juluis Fitzsimmons B, MD   150 mg at 06/12/18 0754  . QUEtiapine (SEROQUEL) tablet 50 mg  50 mg Oral QHS Hesper Venturella B, MD   50 mg at 06/11/18 2144  . tamsulosin (FLOMAX) capsule 0.4 mg  0.4 mg Oral QPC breakfast Ganesh Deeg B, MD   0.4 mg at 06/12/18 0754  . traMADol (ULTRAM) tablet 50 mg  50 mg Oral Q6H PRN Shailey Butterbaugh B, MD   50 mg at 06/11/18 1015  . ziprasidone (GEODON) injection 20 mg  20 mg Intramuscular BID PRN Mossie Gilder B, MD   20 mg at 06/12/18 1045    Lab Results: No results found for this or any previous visit (from the past 48 hour(s)).  Blood Alcohol level:  Lab Results  Component Value Date   ETH <10 06/06/2018   ETH <5 12/13/2015    Metabolic Disorder Labs: Lab Results  Component Value Date   HGBA1C 4.5 (L) 06/09/2018   MPG 82.45 06/09/2018   MPG 100 08/16/2014   No results found for: PROLACTIN Lab Results  Component Value Date   CHOL 140 06/09/2018    TRIG 50 06/09/2018   HDL 59 06/09/2018   CHOLHDL 2.4 06/09/2018   VLDL 10 06/09/2018   LDLCALC 71 06/09/2018   LDLCALC 57 08/16/2014    Physical Findings: AIMS:  , ,  ,  ,    CIWA:    COWS:     Musculoskeletal:  Strength & Muscle Tone: within normal limits Gait & Station: normal Patient leans: N/A  Psychiatric Specialty Exam: Physical Exam  Nursing note and vitals reviewed. Psychiatric: His speech is normal and behavior is normal. Thought content normal. His mood appears anxious. Cognition and memory are impaired. He expresses impulsivity.    Review of Systems  Neurological: Negative.   Psychiatric/Behavioral: Negative.   All other systems reviewed and are negative.   Blood pressure (!) 103/59, pulse 78, temperature 98.1 F (36.7 C), temperature source Oral, resp. rate 18, height 5' 2.01" (1.575 m), weight 49 kg, SpO2 100 %.Body mass index is 19.75 kg/m.  General Appearance: Casual  Eye Contact:  Good  Speech:  Clear and Coherent  Volume:  Normal  Mood:  Anxious  Affect:  Appropriate  Thought Process:  Goal Directed and Descriptions of Associations: Intact  Orientation:  Full (Time, Place, and Person)  Thought Content:  WDL  Suicidal Thoughts:  No  Homicidal Thoughts:  No  Memory:  Immediate;   Fair Recent;   Fair Remote;   Fair  Judgement:  Poor  Insight:  Lacking  Psychomotor Activity:  Normal  Concentration:  Concentration: Fair and Attention Span: Fair  Recall:  [employer] of Knowledge:  Fair  Language:  Fair  Akathisia:  No  Handed:  Right  AIMS (if indicated):     Assets:  Communication Skills Desire for Improvement Financial Resources/Insurance Housing Physical Health Resilience Social Support  ADL's:  Intact  Cognition:  WNL  Sleep:  Number of Hours: 8.75     Treatment Plan Summary: Daily contact with patient to assess and evaluate symptoms and progress in treatment and Medication management   Mr. Agena is a 17 year old male with a  history of mood instability and self injurious behavior admitted for repeated suicide attempts while in jail.  #Agitation -1:1 sitter and security guard -oral Haldol, Ativan, Benadryl available -Geodon 20 mg IM BID PRN   #Mood -continue Seroquel to 50 mg nightly -continue Xanax 1 mg BID -continue Trileptal 150 mg BID  #Status post Ross procedure -Cardiac ECHO completed on 06/11/2018  Study Conclusions  - Left ventricle: The cavity size was normal. Wall thickness was   normal. Systolic function was normal. The estimated ejection   fraction was in the range of 55% to 60%. Wall motion was normal;   there were no regional wall motion abnormalities. Left   ventricular diastolic function parameters were normal. - Atrial septum: Echo contrast study showed no right-to-left atrial   level shunt, at baseline or with provocation. - Tricuspid valve: There was mild regurgitation. - Pulmonary arteries: Systolic pressure was within the normal   range.  Impressions:  - Normal findings post Ross procedure.  -complains of chest pain today, VS are stable   #Urethra foreign body -cystoscopy did not reveal foreign body -urology input is greatly appreciated -self cath for urinary retention -no indication for Flomax but patient claims it helps  #Smoking cessation -nicorette gum PRN  #Chest wall pain -Tramadol 50 mg PRN  #Labs -lipid panel, TSH, A1C are normal  #Social -incompetent adult -[place]  #Disposition -he will return to his group home -needs follow up for mental health, cardiology, urology   Kristine Linea, MD 06/12/2018, 3:07 PM

## 2018-06-11 NOTE — Progress Notes (Signed)
*  PRELIMINARY RESULTS* Echocardiogram 2D Echocardiogram has been performed.  Christian Alexander 06/11/2018, 9:50 AM

## 2018-06-11 NOTE — Plan of Care (Signed)
Patient is actively engaged in self-harm activities having pushed a drinking straw into his penis and now cut his LFA with a patient pen. Patient is not progressing in any positive way. Patient has been threatening to staff ever since being placed on 1:1 sitter. Patient believes he is able to kill all staff with very little effort. Patient has temporarily agreed to contract for safety but remains on 1:1.  Problem: Education: Goal: Emotional status will improve Outcome: Not Progressing Goal: Mental status will improve Outcome: Not Progressing   Problem: Coping: Goal: Ability to interact with others will improve Outcome: Not Progressing Goal: Demonstration of participation in decision-making regarding own care will improve Outcome: Not Progressing   Problem: Self-Concept: Goal: Will verbalize positive feelings about self Outcome: Not Progressing   Problem: Activity: Goal: Interest or engagement in leisure activities will improve Outcome: Not Progressing   Problem: Safety: Goal: Ability to remain free from injury will improve Outcome: Not Progressing   Problem: Skin Integrity: Goal: Risk for impaired skin integrity will decrease Outcome: Not Progressing

## 2018-06-11 NOTE — Progress Notes (Addendum)
[facility] MD Progress Note  06/11/2018 10:12 AM Christian Alexander  MRN:  [id number]  Subjective:    Christian Alexander is cool and collected this morning. He underwent cystoscopy that was uneventful and completed cardiac ECHO. This was a normal study for a patient with a history of Ross procedure. He is complaining of chest wall pain that increases with breathing and touch. He is hoping to be allowed to return to his group home soon. It is his understanding that continuous dangerous misbehaviors would prompt the judge to sending him to "prison or state hospital, [facility], for about three years".   Last night, the patient had multiple episodes of agitation, self injurious and threatening behavior towards staff. He was in restraint chair and given PRN medications. He believes that IM Geodon has been the most effective. He remains on 1:1 sitter with extra security. Apparently, it took seven people to put him in restraint chair last night. He has been complinat with medications.  Principal Problem: Bipolar I disorder, most recent episode depressed, severe without psychotic features (HCC) Diagnosis:   Patient Active Problem List   Diagnosis Date Noted  . Bipolar I disorder, most recent episode depressed, severe without psychotic features (HCC) [F31.4] 06/08/2018    Priority: High  . 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 Bipolar 1 disorder, depressed, moderate (HCC) [F31.32] 08/15/2014  . 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 ODD (oppositional defiant disorder) [F91.3] 08/15/2014  . 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 Homicidal ideation [R45.850]   . Suicidal ideation [R45.851]    Total Time spent with patient: 20 minutes  Past Psychiatric History: bipolar disorder  Past Medical History:  Past Medical History:  Diagnosis Date  . Aortic valve  stenosis   . Attention deficit disorder (ADD)   . ODD (oppositional defiant disorder)     Past Surgical History:  Procedure Laterality Date  . CARDIAC SURGERY     Family History: History reviewed. No pertinent family history. Family Psychiatric  History: father with mental illness Social History:  Social History   Substance and Sexual Activity  Alcohol Use No     Social History   Substance and Sexual Activity  Drug Use Yes  . Types: Marijuana    Social History   Socioeconomic History  . Marital status: Single    Spouse name: Not on file  . Number of children: Not on file  . Years of education: Not on file  . Highest education level: Not on file  Occupational History  . Not on file  Social Needs  . Financial resource strain: Not on file  . Food insecurity:    Worry: Not on file    Inability: Not on file  . Transportation needs:    Medical: Not on file    Non-medical: Not on file  Tobacco Use  . Smoking status: Current Some Day Smoker    Packs/day: 1.00    Types: Cigarettes  . Smokeless tobacco: Never Used  Substance and Sexual Activity  . Alcohol use: No  . Drug use: Yes    Types: Marijuana  . Sexual activity: Never  Lifestyle  . Physical activity:    Days per week: Not on file    Minutes per session: Not on file  . Stress: Not on file  Relationships  . Social connections:    Talks on phone: Not on file    Gets  together: Not on file    Attends religious service: Not on file    Active member of club or organization: Not on file    Attends meetings of clubs or organizations: Not on file    Relationship status: Not on file  Other Topics Concern  . Not on file  Social History Narrative  . Not on file   Additional Social History:                         Sleep: Fair  Appetite:  Fair  Current Medications: Current Facility-Administered Medications  Medication Dose Route Frequency Provider Last Rate Last Dose  . acetaminophen (TYLENOL)  tablet 650 mg  650 mg Oral Q6H PRN Clapacs, Jackquline Denmark, MD   650 mg at 06/10/18 2256  . ALPRAZolam (XANAX) tablet 1 mg  1 mg Oral BID Appolonia Ackert B, MD   1 mg at 06/11/18 0739  . alum & mag hydroxide-simeth (MAALOX/MYLANTA) 200-200-20 MG/5ML suspension 30 mL  30 mL Oral Q4H PRN Clapacs, John T, MD      . asenapine (SAPHRIS) sublingual tablet 5 mg  5 mg Sublingual TID PRN Enoc Getter B, MD   5 mg at 06/10/18 1223  . diphenhydrAMINE (BENADRYL) capsule 50 mg  50 mg Oral Q6H PRN Makoto Sellitto B, MD   50 mg at 06/10/18 2034  . haloperidol (HALDOL) tablet 10 mg  10 mg Oral Q6H PRN Viridiana Spaid B, MD   10 mg at 06/10/18 2034  . LORazepam (ATIVAN) tablet 2 mg  2 mg Oral Q4H PRN Jaques Mineer B, MD   2 mg at 06/11/18 0739  . magnesium hydroxide (MILK OF MAGNESIA) suspension 30 mL  30 mL Oral Daily PRN Clapacs, John T, MD      . nicotine polacrilex (NICORETTE) gum 2 mg  2 mg Oral PRN Heith Haigler B, MD   2 mg at 06/09/18 1116  . OXcarbazepine (TRILEPTAL) tablet 150 mg  150 mg Oral BID Renny Gunnarson B, MD   150 mg at 06/11/18 0739  . QUEtiapine (SEROQUEL) tablet 50 mg  50 mg Oral QHS Weslie Pretlow B, MD   50 mg at 06/10/18 2034  . tamsulosin (FLOMAX) capsule 0.4 mg  0.4 mg Oral QPC breakfast Marizol Borror B, MD   0.4 mg at 06/11/18 0739  . traMADol (ULTRAM) tablet 50 mg  50 mg Oral Q6H PRN Carlisa Eble B, MD      . ziprasidone (GEODON) injection 20 mg  20 mg Intramuscular BID PRN Afia Messenger B, MD   20 mg at 06/11/18 0520    Lab Results: No results found for this or any previous visit (from the past 48 hour(s)).  Blood Alcohol level:  Lab Results  Component Value Date   ETH <10 06/06/2018   ETH <5 12/13/2015    Metabolic Disorder Labs: Lab Results  Component Value Date   HGBA1C 4.5 (L) 06/09/2018   MPG 82.45 06/09/2018   MPG 100 08/16/2014   No results found for: PROLACTIN Lab Results  Component Value Date   CHOL 140  06/09/2018   TRIG 50 06/09/2018   HDL 59 06/09/2018   CHOLHDL 2.4 06/09/2018   VLDL 10 06/09/2018   LDLCALC 71 06/09/2018   LDLCALC 57 08/16/2014    Physical Findings: AIMS:  , ,  ,  ,    CIWA:    COWS:     Musculoskeletal: Strength & Muscle Tone: within normal limits Gait &  Station: normal Patient leans: N/A  Psychiatric Specialty Exam: Physical Exam  Nursing note and vitals reviewed. Psychiatric: His speech is normal and behavior is normal. Thought content normal. His mood appears anxious. His affect is blunt. Cognition and memory are impaired. He expresses impulsivity.    Review of Systems  Cardiovascular: Positive for chest pain.       Chest wall pain  Neurological: Negative.   Psychiatric/Behavioral: Negative.   All other systems reviewed and are negative.   Blood pressure 100/61, pulse 70, temperature 97.6 F (36.4 C), resp. rate 18, height 5' 2.01" (1.575 m), weight 49 kg, SpO2 100 %.Body mass index is 19.75 kg/m.  General Appearance: Casual  Eye Contact:  Good  Speech:  Clear and Coherent  Volume:  Decreased  Mood:  Euthymic  Affect:  Flat  Thought Process:  Goal Directed and Descriptions of Associations: Intact  Orientation:  Full (Time, Place, and Person)  Thought Content:  WDL  Suicidal Thoughts:  No  Homicidal Thoughts:  No  Memory:  Immediate;   Fair Recent;   Fair Remote;   Fair  Judgement:  Poor  Insight:  Lacking  Psychomotor Activity:  Normal  Concentration:  Concentration: Poor and Attention Span: Poor  Recall:  Poor  Fund of Knowledge:  Poor  Language:  Poor  Akathisia:  No  Handed:  Right  AIMS (if indicated):     Assets:  Communication Skills Desire for Improvement Financial Resources/Insurance Physical Health Resilience Social Support  ADL's:  Intact  Cognition:  WNL  Sleep:  Number of Hours: 4.75     Treatment Plan Summary: Daily contact with patient to assess and evaluate symptoms and progress in treatment and Medication  management   Christian Alexander is a 17 year old male with a history of mood instability and self injurious behavior admitted for repeated suicide attempts while in jail.  #Agitation -1:1 sitter and security guard -oral Haldol, Ativan, Benadryl available -Geodon 20 mg IM BID PRN   #Mood -continue Seroquel to 50 mg nightly -continue Xanax 1 mg BID -continue Trileptal 150 mg BID  #Status post Ross procedure -Cardiac ECHO completed on 06/11/2018  Study Conclusions  - Left ventricle: The cavity size was normal. Wall thickness was   normal. Systolic function was normal. The estimated ejection   fraction was in the range of 55% to 60%. Wall motion was normal;   there were no regional wall motion abnormalities. Left   ventricular diastolic function parameters were normal. - Atrial septum: Echo contrast study showed no right-to-left atrial   level shunt, at baseline or with provocation. - Tricuspid valve: There was mild regurgitation. - Pulmonary arteries: Systolic pressure was within the normal   range.  Impressions:  - Normal findings post Ross procedure.  -complains of chest pain today, VS are stable   #Urethra foreign body -cystoscopy did not reveal foreign body -urology input is greatly appreciated -self cath for urinary retention -no indication for Flomax but patient claims it helps  #Smoking cessation -nicorette gum PRN  #Chest wall pain -Tramadol 50 mg PRN  #Labs -lipid panel, TSH, A1C are normal  #Social -incompetent adult -[place]  #Disposition -he will return to his group home -needs follow up for mental health, cardiology, urology  Kristine Linea, MD 06/11/2018, 10:12 AM

## 2018-06-11 NOTE — Tx Team (Addendum)
Interdisciplinary Treatment and Diagnostic Plan Update  06/11/2018 Time of Session: 10:30am Christian Alexander MRN: [id number]  Principal Diagnosis: Bipolar I disorder, most recent episode depressed, severe without psychotic features (HCC)  Secondary Diagnoses: Principal Problem:   Bipolar I disorder, most recent episode depressed, severe without psychotic features (HCC) Active Problems:   Suicidal ideation   Status post aortic valve replacement using Ross procedure   Current Medications:  Current Facility-Administered Medications  Medication Dose Route Frequency Provider Last Rate Last Dose  . acetaminophen (TYLENOL) tablet 650 mg  650 mg Oral Q6H PRN Clapacs, Jackquline Denmark, MD   650 mg at 06/10/18 2256  . ALPRAZolam (XANAX) tablet 1 mg  1 mg Oral BID Pucilowska, Jolanta B, MD   1 mg at 06/11/18 0739  . alum & mag hydroxide-simeth (MAALOX/MYLANTA) 200-200-20 MG/5ML suspension 30 mL  30 mL Oral Q4H PRN Clapacs, John T, MD      . diphenhydrAMINE (BENADRYL) capsule 50 mg  50 mg Oral Q6H PRN Pucilowska, Jolanta B, MD   50 mg at 06/10/18 2034  . haloperidol (HALDOL) tablet 10 mg  10 mg Oral Q6H PRN Pucilowska, Jolanta B, MD   10 mg at 06/10/18 2034  . LORazepam (ATIVAN) tablet 2 mg  2 mg Oral Q4H PRN Pucilowska, Jolanta B, MD   2 mg at 06/11/18 0739  . magnesium hydroxide (MILK OF MAGNESIA) suspension 30 mL  30 mL Oral Daily PRN Clapacs, John T, MD      . nicotine polacrilex (NICORETTE) gum 2 mg  2 mg Oral PRN Pucilowska, Jolanta B, MD   2 mg at 06/09/18 1116  . OXcarbazepine (TRILEPTAL) tablet 150 mg  150 mg Oral BID Pucilowska, Jolanta B, MD   150 mg at 06/11/18 0739  . QUEtiapine (SEROQUEL) tablet 50 mg  50 mg Oral QHS Pucilowska, Jolanta B, MD   50 mg at 06/10/18 2034  . tamsulosin (FLOMAX) capsule 0.4 mg  0.4 mg Oral QPC breakfast Pucilowska, Jolanta B, MD   0.4 mg at 06/11/18 0739  . traMADol (ULTRAM) tablet 50 mg  50 mg Oral Q6H PRN Pucilowska, Jolanta B, MD   50 mg at 06/11/18 1015  .  ziprasidone (GEODON) injection 20 mg  20 mg Intramuscular BID PRN Pucilowska, Jolanta B, MD   20 mg at 06/11/18 0520   PTA Medications: Medications Prior to Admission  Medication Sig Dispense Refill Last Dose  . naltrexone (DEPADE) 50 MG tablet Take 50 mg by mouth daily.  0   . Oxcarbazepine (TRILEPTAL) 300 MG tablet Take 300-900 mg by mouth See admin instructions. Take 1 tablet (300MG ) by mouth every morning and lunchtime and take 3 tablets (900MG ) by mouth every night at bedtime  0   . QUEtiapine (SEROQUEL) 100 MG tablet Take 50-200 mg by mouth See admin instructions. Take  tablet (50MG ) by mouth every morning and 2 tablets (200MG ) by mouth every night at bedtime  0   . tamsulosin (FLOMAX) 0.4 MG CAPS capsule Take 0.4 mg by mouth.   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   Physician Treatment Plan for Primary Diagnosis: Bipolar I disorder, most recent episode depressed, severe without psychotic features (HCC) Long Term Goal(s): Improvement in symptoms so as ready for discharge Improvement in symptoms so as ready for discharge   Short Term Goals: Ability  to identify changes in lifestyle to reduce recurrence of condition will improve Ability to verbalize feelings will improve Ability to disclose and discuss suicidal ideas Ability to demonstrate self-control will improve Ability to identify and develop effective coping behaviors will improve Ability to maintain clinical measurements within normal limits will improve Compliance with prescribed medications will improve Ability to identify triggers associated with substance abuse/mental health issues will improve NA  Medication Management: Evaluate patient's response, side effects, and  tolerance of medication regimen.  Therapeutic Interventions: 1 to 1 sessions, Unit Group sessions and Medication administration.  Evaluation of Outcomes: Not Progressing  Physician Treatment Plan for Secondary Diagnosis: Principal Problem:   Bipolar I disorder, most recent episode depressed, severe without psychotic features (HCC) Active Problems:   Suicidal ideation   Status post aortic valve replacement using Ross procedure  Long Term Goal(s): Improvement in symptoms so as ready for discharge Improvement in symptoms so as ready for discharge   Short Term Goals: Ability to identify changes in lifestyle to reduce recurrence of condition will improve Ability to verbalize feelings will improve Ability to disclose and discuss suicidal ideas Ability to demonstrate self-control will improve Ability to identify and develop effective coping behaviors will improve Ability to maintain clinical measurements within normal limits will improve Compliance with prescribed medications will improve Ability to identify triggers associated with substance abuse/mental health issues will improve NA     Medication Management: Evaluate patient's response, side effects, and tolerance of medication regimen.  Therapeutic Interventions: 1 to 1 sessions, Unit Group sessions and Medication administration.  Evaluation of Outcomes: Not Progressing   RN Treatment Plan for Primary Diagnosis: Bipolar I disorder, most recent episode depressed, severe without psychotic features (HCC) Long Term Goal(s): Knowledge of disease and therapeutic regimen to maintain health will improve  Short Term Goals: Ability to verbalize feelings will improve, Ability to identify and develop effective coping behaviors will improve and Compliance with prescribed medications will improve  Medication Management: RN will administer medications as ordered by provider, will assess and evaluate patient's response and provide education to  patient for prescribed medication. RN will report any adverse and/or side effects to prescribing provider.  Therapeutic Interventions: 1 on 1 counseling sessions, Psychoeducation, Medication administration, Evaluate responses to treatment, Monitor vital signs and CBGs as ordered, Perform/monitor CIWA, COWS, AIMS and Fall Risk screenings as ordered, Perform wound care treatments as ordered.  Evaluation of Outcomes: Not Progressing   LCSW Treatment Plan for Primary Diagnosis: Bipolar I disorder, most recent episode depressed, severe without psychotic features (HCC) Long Term Goal(s): Safe transition to appropriate next level of care at discharge, Engage patient in therapeutic group addressing interpersonal concerns.  Short Term Goals: Engage patient in aftercare planning with referrals and resources, Increase social support, Increase emotional regulation, Facilitate acceptance of mental health diagnosis and concerns, Identify triggers associated with mental health/substance abuse issues and Increase skills for wellness and recovery  Therapeutic Interventions: Assess for all discharge needs, 1 to 1 time with Social worker, Explore available resources and support systems, Assess for adequacy in community support network, Educate family and significant other(s) on suicide prevention, Complete Psychosocial Assessment, Interpersonal group therapy.  Evaluation of Outcomes: Not Progressing   Progress in Treatment: Attending groups: No. Participating in groups: No. Taking medication as prescribed: Yes. Toleration medication: Yes. Family/Significant other contact made: Yes, individual(s) contacted:  spoke with guardian Hilbert Odor Patient understands diagnosis: No. Discussing patient identified problems/goals with staff: No. Medical problems stabilized or resolved: Yes. Denies suicidal/homicidal ideation: No.  Issues/concerns per patient self-inventory: No. Other:   New problem(s) identified: No,  Describe:  None  New Short Term/Long Term Goal(s): Patient did not attend treatment team.  Patient Goals:  Patient did not attend treatment team.  Discharge Plan or Barriers: To be referred to [facility]  Reason for Continuation of Hospitalization: Medication stabilization  Estimated Length of Stay: 7 days   Recreational Therapy: Patient Stressors: N/A Patient Goal: Patient will engage in groups with a calm and appropriate mood at least 2x within 5 recreation therapy group sessions  Attendees: Patient: Christian Alexander 06/11/2018 11:53 AM  Physician: Dr. Jennet Maduro, MD 06/11/2018 11:53 AM  Nursing: Hulan Amato, RN 06/11/2018 11:53 AM  RN Care Manager: 06/11/2018 11:53 AM  Social Worker: Johny Shears, LCSWA 06/11/2018 11:53 AM  Recreational Therapist: Danella Deis. Dreama Saa, LRT 06/11/2018 11:53 AM  Other: Lowella Dandy, LCSW 06/11/2018 11:53 AM  Other: Damian Leavell, Chaplin 06/11/2018 11:53 AM  Other: 06/11/2018 11:53 AM    Scribe for Treatment Team: Johny Shears, LCSW 06/11/2018 11:53 AM

## 2018-06-12 MED ORDER — NON FORMULARY: 1.0000 | Freq: Every day | Status: DC | PRN

## 2018-06-12 NOTE — Progress Notes (Signed)
08:00  Patient appropriate behavior with 1:1.Eating breakfast.  09:00 Patient watching TV.Sitter with patient.  10:00  Patient appropriate behavior with 1:1.  11:00  Patient asked for Geodon for raising thoughts.Geodon IM given.sitter with patient.  12:00 Patient resting in bed with eyes closed.Sitter at bedside.  13:00 Eating lunch.Sitter with patient.  14:00 Patient playing outside with sitter,security and peers.

## 2018-06-12 NOTE — Progress Notes (Signed)
15:00 Patient playing outside.Producer, television/film/video with patient.  16:00 Patient appropriate behavior with 1:1.  17:00 Patient appropriate behavior with 1:1.  18:00 Patient appropriate behavior with 1:1.

## 2018-06-12 NOTE — Plan of Care (Signed)
Patient is appropriate in the unit.No aggressive or self harm behaviors noted.Denies HI and AVH.Geodon IM given as per patient request for raising thoughts.Patient was appropriate in the courtyard with staff & peers.Appetite and energy level good.

## 2018-06-12 NOTE — BH Assessment (Signed)
Writer called [facility] [phone]) to follow up with the status of the patient's referral. He states the patient is still pending but he can not locate the packet at this time. Was advised to call back tomorrow due to the belief Chrissie Noa has the patient's information (referral packet).

## 2018-06-12 NOTE — Progress Notes (Addendum)
1:1 note  2000- Patient in TV room with sitter and security officer at side. Watching TV. Requested and was given Geodon IM for complaints of agitation and "nerves."  2100- Patient sleeping. Sitter at bedside. 2200- Patient sleeping. Sitter at bedside. 2300- Patient sleeping. Sitter at bedside. 0000- Patient sleeping. Sitter at bedside. 0100- Patient awake, eating a snack @0120 . Patient reports anxiety and is given Ativan 2mg  PO. Remains on 1:1 sitter.  0200- Patient resting quietly. Sitter at bedside. 0300- Patient sleeping. Sitter at bedside. 0400- Patient sleeping. Sitter at bedside. 0500- Patient sleeping. Sitter at bedside. 0600- Compliant with VS. VSS. Remains in bed sleeping. Sitter at bedside. 0700- Patient sleeping. Sitter at bedside.

## 2018-06-12 NOTE — Plan of Care (Addendum)
Patient found in back TV room with 1:1 sitter and security officer at side. Patient is more personable and less guarded this evening. Patient reports that he is happy regarding the dispensation of his court case and that he is hoping to return to his group home within the week. Patient reports "the doctor told me I could go if I do what I'm supposed to do here for a couple of days." Patient reports that his "nerves" are starting up and requests and is given Geodon IM. Denies SI/HI/AVH at this time. Patient has been sleeping since 2000. Remains on 1:1 due to propensity to insert things into his penis or self-harm when not monitored constantly. Reports he is urinating normally after procedure to remove foreign object from penis earlier in the day. Compliant with HS medication. Will continue to monitor throughout the shift. Patient slept 8.75 hours. No apparent distress. Will endorse care to oncoming shift.  Problem: Education: Goal: Emotional status will improve Outcome: Progressing Goal: Mental status will improve Outcome: Progressing   Problem: Coping: Goal: Ability to interact with others will improve Outcome: Progressing   Problem: Nutrition: Goal: Adequate nutrition will be maintained Outcome: Progressing   Problem: Coping: Goal: Level of anxiety will decrease Outcome: Progressing   Problem: Elimination: Goal: Will not experience complications related to urinary retention Outcome: Progressing   Problem: Activity: Goal: Interest or engagement in leisure activities will improve Outcome: Not Progressing

## 2018-06-12 NOTE — BH Assessment (Signed)
Requested information (Psych MD Notes) faxed to [facility] ([phone]) and confirmed it was received. Information pending review.

## 2018-06-12 NOTE — Progress Notes (Addendum)
[facility] MD Progress Note  06/13/2018 1:58 PM Christian Alexander  MRN:  [id number]  Subjective:   I had a very pleasant conversation with the patient yesterday. Today, he feels on edge and agitated from hearing voices. He already received Geodon 20 mg IM PRN and his regular dose of morning Xanax. He is eating lunch and seems sedated but nevertheless complains of feeling of agitation. At the same time he is trying to convince me that he is ready to be discharged back to his group home.  He was explained time and time again that he has to show [place] that he is in control of his behavior for several days before we discuss discharge.    He is on rather low doses of Seroquel 50 mg and Trileptal 150 mg BID by his own choice. He felt that Xanax 1 mg BID could be helpful but I do not believe it makes a difference. He likes IM Geodon and asks for it twice a day for agitation.   I am not sure if Clozapine has been tried before. The guardian has no evidence that it was and approves of Clozapine. Will start Clozapine tonight.  Principal Problem: Bipolar I disorder, most recent episode depressed, severe without psychotic features (HCC) Diagnosis:   Patient Active Problem List   Diagnosis Date Noted  . Bipolar I disorder, most recent episode depressed, severe without psychotic features (HCC) [F31.4] 06/08/2018    Priority: High  . 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 Bipolar 1 disorder, depressed, moderate (HCC) [F31.32] 08/15/2014  . 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 ODD (oppositional defiant disorder) [F91.3] 08/15/2014  . 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 Homicidal ideation [R45.850]   . Suicidal ideation [R45.851]    Total Time spent with patient: 20 minutes  Past Psychiatric History: bipolar disorder  Past Medical History:  Past  Medical History:  Diagnosis Date  . Aortic valve stenosis   . Attention deficit disorder (ADD)   . ODD (oppositional defiant disorder)     Past Surgical History:  Procedure Laterality Date  . CARDIAC SURGERY     Family History: History reviewed. No pertinent family history. Family Psychiatric  History: father with mental illness Social History:  Social History   Substance and Sexual Activity  Alcohol Use No     Social History   Substance and Sexual Activity  Drug Use Yes  . Types: Marijuana    Social History   Socioeconomic History  . Marital status: Single    Spouse name: Not on file  . Number of children: Not on file  . Years of education: Not on file  . Highest education level: Not on file  Occupational History  . Not on file  Social Needs  . Financial resource strain: Not on file  . Food insecurity:    Worry: Not on file    Inability: Not on file  . Transportation needs:    Medical: Not on file    Non-medical: Not on file  Tobacco Use  . Smoking status: Current Some Day Smoker    Packs/day: 1.00    Types: Cigarettes  . Smokeless tobacco: Never Used  Substance and Sexual Activity  . Alcohol use: No  . Drug use: Yes    Types: Marijuana  . Sexual activity: Never  Lifestyle  . Physical activity:    Days per week: Not on file  Minutes per session: Not on file  . Stress: Not on file  Relationships  . Social connections:    Talks on phone: Not on file    Gets together: Not on file    Attends religious service: Not on file    Active member of club or organization: Not on file    Attends meetings of clubs or organizations: Not on file    Relationship status: Not on file  Other Topics Concern  . Not on file  Social History Narrative  . Not on file   Additional Social History:                         Sleep: Fair  Appetite:  Fair  Current Medications: Current Facility-Administered Medications  Medication Dose Route Frequency Provider  Last Rate Last Dose  . acetaminophen (TYLENOL) tablet 650 mg  650 mg Oral Q6H PRN Clapacs, Jackquline Denmark, MD   650 mg at 06/10/18 2256  . ALPRAZolam (XANAX) tablet 1 mg  1 mg Oral BID Pucilowska, Jolanta B, MD   1 mg at 06/13/18 0802  . alum & mag hydroxide-simeth (MAALOX/MYLANTA) 200-200-20 MG/5ML suspension 30 mL  30 mL Oral Q4H PRN Clapacs, John T, MD      . cloZAPine (CLOZARIL) tablet 25 mg  25 mg Oral QHS Pucilowska, Jolanta B, MD      . haloperidol (HALDOL) tablet 10 mg  10 mg Oral Q6H PRN Pucilowska, Jolanta B, MD   10 mg at 06/10/18 2034  . LORazepam (ATIVAN) tablet 2 mg  2 mg Oral Q6H PRN Pucilowska, Jolanta B, MD      . magnesium hydroxide (MILK OF MAGNESIA) suspension 30 mL  30 mL Oral Daily PRN Clapacs, John T, MD      . nicotine polacrilex (NICORETTE) gum 2 mg  2 mg Oral PRN Pucilowska, Jolanta B, MD   2 mg at 06/11/18 1400  . NON FORMULARY 1 Inhaler  1 Inhaler Oral Daily PRN Pucilowska, Jolanta B, MD      . OXcarbazepine (TRILEPTAL) tablet 150 mg  150 mg Oral BID Pucilowska, Jolanta B, MD   150 mg at 06/13/18 0810  . QUEtiapine (SEROQUEL) tablet 50 mg  50 mg Oral QHS Pucilowska, Jolanta B, MD   50 mg at 06/12/18 2111  . tamsulosin (FLOMAX) capsule 0.4 mg  0.4 mg Oral QPC breakfast Pucilowska, Jolanta B, MD   0.4 mg at 06/13/18 0802  . traMADol (ULTRAM) tablet 50 mg  50 mg Oral Q6H PRN Pucilowska, Jolanta B, MD   50 mg at 06/11/18 1015  . ziprasidone (GEODON) injection 20 mg  20 mg Intramuscular BID PRN Pucilowska, Jolanta B, MD   20 mg at 06/13/18 0909    Lab Results: No results found for this or any previous visit (from the past 48 hour(s)).  Blood Alcohol level:  Lab Results  Component Value Date   ETH <10 06/06/2018   ETH <5 12/13/2015    Metabolic Disorder Labs: Lab Results  Component Value Date   HGBA1C 4.5 (L) 06/09/2018   MPG 82.45 06/09/2018   MPG 100 08/16/2014   No results found for: PROLACTIN Lab Results  Component Value Date   CHOL 140 06/09/2018   TRIG 50  06/09/2018   HDL 59 06/09/2018   CHOLHDL 2.4 06/09/2018   VLDL 10 06/09/2018   LDLCALC 71 06/09/2018   LDLCALC 57 08/16/2014    Physical Findings: AIMS:  , ,  ,  ,  CIWA:    COWS:     Musculoskeletal: Strength & Muscle Tone: within normal limits Gait & Station: normal Patient leans: N/A  Psychiatric Specialty Exam: Physical Exam  Nursing note and vitals reviewed. Psychiatric: He has a normal mood and affect. His speech is normal and behavior is normal. Thought content normal. Cognition and memory are normal. He expresses impulsivity.    Review of Systems  Neurological: Negative.   Psychiatric/Behavioral: Negative.   All other systems reviewed and are negative.   Blood pressure 115/60, pulse 79, temperature 98.1 F (36.7 C), temperature source Oral, resp. rate 18, height 5' 2.01" (1.575 m), weight 49 kg, SpO2 98 %.Body mass index is 19.75 kg/m.  General Appearance: Casual  Eye Contact:  Good  Speech:  Clear and Coherent  Volume:  Normal  Mood:  Anxious  Affect:  Labile  Thought Process:  Goal Directed and Descriptions of Associations: Intact  Orientation:  Full (Time, Place, and Person)  Thought Content:  WDL  Suicidal Thoughts:  No  Homicidal Thoughts:  No  Memory:  Immediate;   Fair Recent;   Fair Remote;   Fair  Judgement:  Poor  Insight:  Lacking  Psychomotor Activity:  Normal  Concentration:  Concentration: Fair and Attention Span: Fair  Recall:  [employer] of Knowledge:  Fair  Language:  Fair  Akathisia:  No  Handed:  Right  AIMS (if indicated):     Assets:  Communication Skills Desire for Improvement Financial Resources/Insurance Housing Physical Health Resilience Social Support  ADL's:  Intact  Cognition:  WNL  Sleep:  Number of Hours: 4     Treatment Plan Summary: Daily contact with patient to assess and evaluate symptoms and progress in treatment and Medication management   Mr. Zuver is a 17 year old male with a history of mood  instability and self injurious behavior admitted for repeated suicide attempts while in jail.  #Agitation -1:1 sitter and security guard -oral Haldol, Ativan available -Geodon 20 mg IM BID PRN   #Mood/psychosis -continueSeroquel to 50 mg nightly -continueXanax 1 mg BID -continue Trileptal 150 mg BID -start Clozapine 25 mg nightly  #Status post Ross procedure -Cardiac ECHO completed on 06/11/2018  Study Conclusions  - Left ventricle: The cavity size was normal. Wall thickness was normal. Systolic function was normal. The estimated ejection fraction was in the range of 55% to 60%. Wall motion was normal; there were no regional wall motion abnormalities. Left ventricular diastolic function parameters were normal. - Atrial septum: Echo contrast study showed no right-to-left atrial level shunt, at baseline or with provocation. - Tricuspid valve: There was mild regurgitation. - Pulmonary arteries: Systolic pressure was within the normal range.  Impressions:  - Normal findings post Ross procedure.  -complains of chest pain today, VS are stable   #Urethra foreign body -cystoscopy did not reveal foreign body -urology input is greatly appreciated -self cath for urinary retention -no indication for Flomax but patient claims it helps  #Smoking cessation -nicorette gum PRN  #Chest wall pain -Tramadol 50 mg PRN  #Labs -lipid panel, TSH, A1C are normal  #Social -incompetent adult [place]  #Disposition -he will return to his group home -needs follow up for mental health, cardiology, urology  Kristine Linea, MD 06/13/2018, 1:58 PM

## 2018-06-12 NOTE — Progress Notes (Signed)
Recreation Therapy Notes  Date: 06/12/2018  Time: 9:30 am   Location: Craft room   Behavioral response: N/A   Intervention Topic: Life planning  Discussion/Intervention: Patient did not attend group.   Clinical Observations/Feedback:  Patient did not attend group.   Molly Maselli LRT/CTRS        Nealie Mchatton 06/12/2018 10:39 AM

## 2018-06-13 LAB — CBC WITH DIFFERENTIAL/PLATELET
Abs Immature Granulocytes: 0.23 10*3/uL — ABNORMAL HIGH (ref 0.00–0.07)
BASOS ABS: 0.1 10*3/uL (ref 0.0–0.1)
BASOS PCT: 1 %
Eosinophils Absolute: 0.1 10*3/uL (ref 0.0–0.5)
Eosinophils Relative: 1 %
HCT: 42.3 % (ref 39.0–52.0)
Hemoglobin: 14.8 g/dL (ref 13.0–17.0)
IMMATURE GRANULOCYTES: 2 %
Lymphocytes Relative: 19 %
Lymphs Abs: 1.9 10*3/uL (ref 0.7–4.0)
MCH: 29.8 pg (ref 26.0–34.0)
MCHC: 35 g/dL (ref 30.0–36.0)
MCV: 85.3 fL (ref 80.0–100.0)
Monocytes Absolute: 1.3 10*3/uL — ABNORMAL HIGH (ref 0.1–1.0)
Monocytes Relative: 13 %
NEUTROS ABS: 6.6 10*3/uL (ref 1.7–7.7)
NEUTROS PCT: 64 %
NRBC: 0 % (ref 0.0–0.2)
PLATELETS: 160 10*3/uL (ref 150–400)
RBC: 4.96 MIL/uL (ref 4.22–5.81)
RDW: 13.8 % (ref 11.5–15.5)
WBC: 10.2 10*3/uL (ref 4.0–10.5)

## 2018-06-13 MED ORDER — LORAZEPAM 2 MG PO TABS
2.0000 mg | ORAL_TABLET | Freq: Four times a day (QID) | ORAL | Status: DC | PRN
  Administered 2018-06-13 – 2018-06-21 (×9): 2 mg via ORAL
  Filled 2018-06-13 (×10): qty 1

## 2018-06-13 MED ORDER — CLOZAPINE 25 MG PO TABS
25.0000 mg | ORAL_TABLET | Freq: Every day | ORAL | Status: DC
  Administered 2018-06-13 – 2018-06-14 (×2): 25 mg via ORAL
  Filled 2018-06-13 (×2): qty 1

## 2018-06-13 NOTE — [facility] Group Notes (Signed)
  Emotional Regulation June 13, 2018  1PM  Type of Therapy/Topic:  Group Therapy:  Emotion Regulation  Participation Level:  Did Not Attend   Description of Group:   The purpose of this group is to assist patients in learning to regulate negative emotions and experience positive emotions. Patients will be guided to discuss ways in which they have been vulnerable to their negative emotions. These vulnerabilities will be juxtaposed with experiences of positive emotions or situations, and patients will be challenged to use positive emotions to combat negative ones. Special emphasis will be placed on coping with negative emotions in conflict situations, and patients will process healthy conflict resolution skills.  Therapeutic Goals: 1. Patient will identify two positive emotions or experiences to reflect on in order to balance out negative emotions 2. Patient will label two or more emotions that they find the most difficult to experience 3. Patient will demonstrate positive conflict resolution skills through discussion and/or role plays  Summary of Patient Progress:       Therapeutic Modalities:   Cognitive Behavioral Therapy Feelings Identification Dialectical Behavioral Therapy  [place] Jillian Warth LCSW [phone] 

## 2018-06-13 NOTE — Progress Notes (Signed)
Clozapine Monitoring  Patient is a 17yo male being started on Clozapine. ANC of 6600 is acceptable. Verified eligibility with Clozapine registry.  Patient will require weekly ANC checks. Next CBC w/ diff will be due on 06/20/18.  Clovia Cuff, PharmD, BCPS 06/13/2018 5:58 PM

## 2018-06-13 NOTE — [facility] Group Notes (Signed)
[facility] Group Notes:  (Nursing/MHT/Case Management/Adjunct)  Date:  06/13/2018  Time:  2:49 PM  Type of Therapy:  Psychoeducational Skills  Participation Level:  Did Not Attend  Lynelle Smoke [facility] 06/13/2018, 2:49 PM

## 2018-06-13 NOTE — Progress Notes (Addendum)
[facility] MD Progress Note  06/13/2018 8:11 PM Barbara Keng  MRN:  [id number]  Subjective:   Christian Alexander had a difficult night in spite of multiple PRN. Became agitated on the phone trying to report problems at the group home he resides in. He believed that had a "code" for cameras and insisted police goes there to review recordings of "dugs on the table". He broke off a piece of furniture that could never be found and was minitored even closer all night long. He was up at 3 AM increasingly bored and aggravated. In the morning, her requested to be placed in restraint chair so "not to hurt himself" but soon changed his mind.  He requested to speak with the psychiatrist, nurse and his SW. He requests immediate discharge feeling ready. When reminded that he needs to have several days of acceptable behavior, he argued that since Sunday, when he was in restraint, he has been doing well. This is incorrect. He is frequently out of control and requires multiple PRNs each day, including IM injections, that can not be provided at the group home. As I was leaving the room, he started cursing and banging on the furniture. He receive IM Geodon.  In spite of periods of agitation, he has been compliant with medications, including first dose of Clozapine last night.    Principal Problem: Bipolar I disorder, most recent episode depressed, severe without psychotic features (HCC) Diagnosis:   Patient Active Problem List   Diagnosis Date Noted  . Bipolar I disorder, most recent episode depressed, severe without psychotic features (HCC) [F31.4] 06/08/2018    Priority: High  . 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 Bipolar 1 disorder, depressed, moderate (HCC) [F31.32] 08/15/2014  . 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 ODD (oppositional defiant disorder) [F91.3]  08/15/2014  . 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 Homicidal ideation [R45.850]   . Suicidal ideation [R45.851]    Total Time spent with patient: 20 minutes  Past Psychiatric History: bipolar disorder  Past Medical History:  Past Medical History:  Diagnosis Date  . Aortic valve stenosis   . Attention deficit disorder (ADD)   . ODD (oppositional defiant disorder)     Past Surgical History:  Procedure Laterality Date  . CARDIAC SURGERY     Family History: History reviewed. No pertinent family history. Family Psychiatric  History: father with mental illness Social History:  Social History   Substance and Sexual Activity  Alcohol Use No     Social History   Substance and Sexual Activity  Drug Use Yes  . Types: Marijuana    Social History   Socioeconomic History  . Marital status: Single    Spouse name: Not on file  . Number of children: Not on file  . Years of education: Not on file  . Highest education level: Not on file  Occupational History  . Not on file  Social Needs  . Financial resource strain: Not on file  . Food insecurity:    Worry: Not on file    Inability: Not on file  . Transportation needs:    Medical: Not on file    Non-medical: Not on file  Tobacco Use  . Smoking status: Current Some Day Smoker    Packs/day: 1.00    Types: Cigarettes  . Smokeless tobacco: Never Used  Substance and Sexual Activity  . Alcohol use: No  .  Drug use: Yes    Types: Marijuana  . Sexual activity: Never  Lifestyle  . Physical activity:    Days per week: Not on file    Minutes per session: Not on file  . Stress: Not on file  Relationships  . Social connections:    Talks on phone: Not on file    Gets together: Not on file    Attends religious service: Not on file    Active member of club or organization: Not on file    Attends meetings of clubs or organizations: Not on file    Relationship status: Not on file  Other Topics Concern  . Not on file   Social History Narrative  . Not on file   Additional Social History:                         Sleep: Poor  Appetite:  Fair  Current Medications: Current Facility-Administered Medications  Medication Dose Route Frequency Provider Last Rate Last Dose  . acetaminophen (TYLENOL) tablet 650 mg  650 mg Oral Q6H PRN Clapacs, Jackquline Denmark, MD   650 mg at 06/10/18 2256  . ALPRAZolam (XANAX) tablet 1 mg  1 mg Oral BID Mackenze Grandison B, MD   1 mg at 06/13/18 1706  . alum & mag hydroxide-simeth (MAALOX/MYLANTA) 200-200-20 MG/5ML suspension 30 mL  30 mL Oral Q4H PRN Clapacs, John T, MD      . cloZAPine (CLOZARIL) tablet 25 mg  25 mg Oral QHS Addaline Peplinski B, MD   25 mg at 06/13/18 2008  . haloperidol (HALDOL) tablet 10 mg  10 mg Oral Q6H PRN Dyonna Jaspers B, MD   10 mg at 06/10/18 2034  . LORazepam (ATIVAN) tablet 2 mg  2 mg Oral Q6H PRN Emillie Chasen B, MD   2 mg at 06/13/18 2007  . magnesium hydroxide (MILK OF MAGNESIA) suspension 30 mL  30 mL Oral Daily PRN Clapacs, John T, MD      . nicotine polacrilex (NICORETTE) gum 2 mg  2 mg Oral PRN Terilyn Sano B, MD   2 mg at 06/11/18 1400  . NON FORMULARY 1 Inhaler  1 Inhaler Oral Daily PRN Marchia Diguglielmo B, MD      . OXcarbazepine (TRILEPTAL) tablet 150 mg  150 mg Oral BID Orianna Biskup B, MD   150 mg at 06/13/18 1706  . QUEtiapine (SEROQUEL) tablet 50 mg  50 mg Oral QHS Jasleen Riepe B, MD   50 mg at 06/13/18 2006  . tamsulosin (FLOMAX) capsule 0.4 mg  0.4 mg Oral QPC breakfast Naiyana Barbian B, MD   0.4 mg at 06/13/18 0802  . traMADol (ULTRAM) tablet 50 mg  50 mg Oral Q6H PRN Terrence Pizana B, MD   50 mg at 06/11/18 1015  . ziprasidone (GEODON) injection 20 mg  20 mg Intramuscular BID PRN Lendon George B, MD   20 mg at 06/13/18 0909    Lab Results:  Results for orders placed or performed during the hospital encounter of 06/08/18 (from the past 48 hour(s))  CBC with  Differential/Platelet     Status: Abnormal   Collection Time: 06/13/18  2:30 PM  Result Value Ref Range   WBC 10.2 4.0 - 10.5 K/uL   RBC 4.96 4.22 - 5.81 MIL/uL   Hemoglobin 14.8 13.0 - 17.0 g/dL   HCT 16.1 09.6 - 04.5 %   MCV 85.3 80.0 - 100.0 fL   MCH 29.8 26.0 -  34.0 pg   MCHC 35.0 30.0 - 36.0 g/dL   RDW 16.1 09.6 - 04.5 %   Platelets 160 150 - 400 K/uL   nRBC 0.0 0.0 - 0.2 %   Neutrophils Relative % 64 %   Neutro Abs 6.6 1.7 - 7.7 K/uL   Lymphocytes Relative 19 %   Lymphs Abs 1.9 0.7 - 4.0 K/uL   Monocytes Relative 13 %   Monocytes Absolute 1.3 (H) 0.1 - 1.0 K/uL   Eosinophils Relative 1 %   Eosinophils Absolute 0.1 0.0 - 0.5 K/uL   Basophils Relative 1 %   Basophils Absolute 0.1 0.0 - 0.1 K/uL   Immature Granulocytes 2 %   Abs Immature Granulocytes 0.23 (H) 0.00 - 0.07 K/uL    Comment: Performed at [facility], [address] [place], [place] [address]    Blood Alcohol level:  Lab Results  Component Value Date   [facility] <10 06/06/2018   ETH <5 12/13/2015    Metabolic Disorder Labs: Lab Results  Component Value Date   HGBA1C 4.5 (L) 06/09/2018   MPG 82.45 06/09/2018   MPG 100 08/16/2014   No results found for: PROLACTIN Lab Results  Component Value Date   CHOL 140 06/09/2018   TRIG 50 06/09/2018   HDL 59 06/09/2018   CHOLHDL 2.4 06/09/2018   VLDL 10 06/09/2018   LDLCALC 71 06/09/2018   LDLCALC 57 08/16/2014    Physical Findings: AIMS:  , ,  ,  ,    CIWA:    COWS:     Musculoskeletal: Strength & Muscle Tone: within normal limits Gait & Station: normal Patient leans: N/A  Psychiatric Specialty Exam: Physical Exam  Nursing note and vitals reviewed. Psychiatric: His speech is normal and behavior is normal. Thought content normal. His mood appears anxious. Cognition and memory are impaired. He expresses impulsivity.    Review of Systems  Neurological: Negative.   Psychiatric/Behavioral: Negative.   All other systems reviewed and are  negative.   Blood pressure 115/60, pulse 79, temperature 98.1 F (36.7 C), temperature source Oral, resp. rate 18, height 5' 2.01" (1.575 m), weight 49 kg, SpO2 98 %.Body mass index is 19.75 kg/m.  General Appearance: Casual  Eye Contact:  Good  Speech:  Clear and Coherent  Volume:  Normal  Mood:  Anxious  Affect:  Labile  Thought Process:  Goal Directed  Orientation:  Full (Time, Place, and Person)  Thought Content:  Hallucinations: Auditory Command:  tohurt himself  Suicidal Thoughts:  No  Homicidal Thoughts:  No  Memory:  Immediate;   Fair Recent;   Fair Remote;   Fair  Judgement:  Poor  Insight:  Lacking  Psychomotor Activity:  Increased  Concentration:  Concentration: Poor and Attention Span: Poor  Recall:  Poor  Fund of Knowledge:  Fair  Language:  Fair  Akathisia:  No  Handed:  Right  AIMS (if indicated):     Assets:  Communication Skills Desire for Improvement Financial Resources/Insurance Housing Physical Health Resilience Social Support  ADL's:  Intact  Cognition:  WNL  Sleep:  Number of Hours: 4     Treatment Plan Summary: Daily contact with patient to assess and evaluate symptoms and progress in treatment and Medication management   Mr. Mcewan is a 17 year old male with a history of mood instability and self injurious behavior admitted for repeated suicide attempts while in jail. He has been frequently agitated on the unit, has currently 2:1 security detail, and receives PRNs daily.  Spoke with the guardian several times, who approved our attempts to treat.  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 Wall thickness was normal. Systolic function was normal. The estimated ejection fraction was in the range of 55% to 60%. Wall motion was normal; there were no regional wall motion abnormalities. Left ventricular diastolic function parameters were normal. - Atrial septum: Echo contrast study showed no right-to-left atrial level shunt, at baseline or with provocation. - Tricuspid valve: There was mild regurgitation. - Pulmonary arteries: Systolic pressure was within the normal range.  Impressions:  - Normal findings post Ross procedure.  #Urethra foreign body insertion -cystoscopy did not reveal foreign body -urology input is greatly appreciated -self cath for urinary retention -no indication for Flomax but patient claims it helps  #Smoking cessation -nicorette gum PRN  #Chest wall pain, resolved -discontinue Tramadol 50 mg PRN  #Labs -lipid panel, TSH, A1C are normal  #Social -incompetent adult -[place]  #Disposition -he will return to his group home -needs follow up for mental health, cardiology, urology  Kristine Linea, MD 06/13/2018, 8:11 PM

## 2018-06-13 NOTE — Progress Notes (Signed)
1:1 Note  2000- Patient awake in hallway. Sitter at side. Security in hallway. 2100- Patient sleeping. Sitter at bedside. 2200- Patient sleeping. Sitter at bedside. 2300- Patient sleeping. Sitter at bedside. 0000- Patient eating snack. Sitter at side. 0100- Patient sleeping. Sitter at bedside. 0200- Patient awake and voicing agitation. Reports he is hearing voices to hurt himself. Given Ativan 2mg  PO. Will monitor for efficacy. Sitter at bedside. Security guard in hallway. 0300- Patient remains awake. Patient remains anxious and continues to report voices. Behavior is calm. Sitter at bedside. 0400- Patient remains awake. Talking calmly with sitter. Provided reassurance. Sitter at bedside. Security guard in hall. 0500- Patient remains awake. Sitter at bedside. Security guard in hall. 0600- Patient remains awake. Complains of anxiety, given Ativan PO. Sitter at bedside. Security in hall. 0700- Patient asleep. Sitter at bedside.

## 2018-06-13 NOTE — Progress Notes (Signed)
Recreation Therapy Notes  Date: 06/13/2018  Time: 9:30 am   Location: Craft room   Behavioral response: N/A   Intervention Topic: Goals  Discussion/Intervention: Patient did not attend group.   Clinical Observations/Feedback:  Patient did not attend group.   Dan Dissinger LRT/CTRS        Huber Mathers 06/13/2018 11:26 AM

## 2018-06-13 NOTE — Progress Notes (Signed)
Patient made multiple  Phone calls to the sheriff department and became agitated thereafter. Started displaying aggressive behaviors, threatening staff, screaming, yelling hitting the walls and window, making other patients scared and uncomfortable. Patient attempted to lift furniture in order to use it as a weapon.   Complaining that "no one wants to listen to me, no one wants to understand my goal. I am trying to help my friends at the group home but you f* don't want to help me".  Attempted to scratch his forearm and to cut it with a paper. Became more and more aggressive, cursing. Requested to speak sergent [place]. Christian Alexander came in and spoke with patient. Patient remained agitated and non compliant. Received Geodon IM as prescribed and MD was notified. Patient stated "that doctor needs to be fired, she is not smart". Patient accepted the medication and had a snack. Went to bed a few minutes later.  @2200 : Patient fell asleep, sitter at bedside.

## 2018-06-13 NOTE — Plan of Care (Signed)
Agitated, aggressive and restless

## 2018-06-13 NOTE — Progress Notes (Signed)
Patient currently sleeping, sitter at bedside. No sign of distress.

## 2018-06-13 NOTE — Progress Notes (Signed)
Sitter Note:  0800: Patient up in the hallway communicating with staff member. Calm at this time. Recruitment consultant and security in place. 0900: Patient complained of feeling agitated and hearing voices telling him to harm himself. Verbally contracts for safety. Recruitment consultant and security in place. 1000: Patient in bed resting with eyes close. Recruitment consultant and security in place. 1100: Patient in the hallway asking staff to inform MD that he wants to speak to her. Patient calm at this time. Recruitment consultant and security in place. 1200: Patient sitting in the dayroom on the green hall eating his lunch. Recruitment consultant and security in place. 1300: Patient in the dayroom interacting with staff and security. Milieu remains safe and patient and is calm at this time. 1400: Patient in the dayroom on the green hall, interacting with Database administrator. Patient requesting to see his Child psychotherapist. Cassandra J notified of this request. 1500: Patient alert and oriented to self, present in the dayroom on the green hall with Database administrator. Patient is calm at this time. 1600: Patient in the day room on the  [facility] communicating with staff. No distress noted. Environmental education officer in place. 1700: Patient in hall talking to this writer, no distress noted. 1800: Patient standing in the hallway outside of his room interacting with safety sitter and security. No distress noted. 1900: Patient on the phone at this time with safety sitter and security in place. No agitation noted at this time.

## 2018-06-13 NOTE — Progress Notes (Signed)
Patient complained of anxiety and restlessness and requested his bedtime medication. Patient also received Ativan 2 mg by mouth as prescribed PRN. Continues to frequently make phone calls, saying that he is trying to advocate for the other residents at the group home. Has no other concerns so far. Staff continue to monitor on 1:1.

## 2018-06-13 NOTE — Plan of Care (Addendum)
Patient found in back TV room upon my arrival. Patient is visible and social with staff this evening. Patient reports increased anxiety and agitation but denies SI/HI/AVH. Requests and is given Geodon IM with HS quetiapine. Patient remains on 1:1 in back of unit for his safety. Constantly monitored for contraband. Patient has no self-harm behaviors or threats this evening. Appetite and voiding are adequate. Denies pain. Compliant with HS medication and 1:1 sitter. Will continue to monitor throughout the shift. @0200 , patient complains of agitation and voices. Given Ativan 2mg  PO. Patient refuses Haldol due to urinary retention issues. Patient encouraged to use his coping skills and attempt to maintain control. Patient agrees. Contracts for safety. Remains on 1:1. Sitter at bedside. Patient slept 4 hours. Restless. Patient has been awake since 0200. Complaining of anxiety/agitation. Given Ativan 2mg  PO. Will endorse care to oncoming shift.  Problem: Education: Goal: Emotional status will improve Outcome: Progressing Goal: Mental status will improve Outcome: Progressing   Problem: Coping: Goal: Ability to interact with others will improve Outcome: Progressing   Problem: Nutrition: Goal: Adequate nutrition will be maintained Outcome: Progressing

## 2018-06-13 NOTE — Progress Notes (Signed)
Patient currently in the hallway with sitter and security. Alert and oriented. Frequently redirected but has no sign of aggressive behavior. Frequently making phone calls. No sign of distress. Staff continue to monitor.

## 2018-06-14 MED ORDER — TEMAZEPAM 15 MG PO CAPS
30.0000 mg | ORAL_CAPSULE | Freq: Every evening | ORAL | Status: DC | PRN
  Administered 2018-06-15 – 2018-06-21 (×7): 30 mg via ORAL
  Filled 2018-06-14 (×7): qty 2

## 2018-06-14 MED ORDER — CHLORPROMAZINE HCL 25 MG/ML IJ SOLN
100.0000 mg | Freq: Three times a day (TID) | INTRAMUSCULAR | Status: DC | PRN
  Administered 2018-06-14 – 2018-06-15 (×2): 100 mg via INTRAMUSCULAR
  Filled 2018-06-14 (×6): qty 4

## 2018-06-14 NOTE — Progress Notes (Signed)
Patient currently in the dayroom with staff. Watching TV. Cooperative. Remains on 1:1 for safety.

## 2018-06-14 NOTE — Progress Notes (Signed)
Patient ID: Christian Alexander, male   DOB: 03-30-2001, 17 y.o.   MRN: [id number]   Patient agitated, not responding to redirection, threatening to hurt himsel.  1. Restraint chair. 2. Thorazine 100 mg IM PRN

## 2018-06-14 NOTE — Progress Notes (Signed)
Patient at this time observed with 1:1 present and security for safety. Patient is sleeping in his room at this time with unlabored respirations and no distress noted. Pt. Woken up to take his night medications that he was complaint with, no behavioral issues to report at this time. Will continue to monitor for safety. Pt. Proceeded to return to sleeping.

## 2018-06-14 NOTE — Progress Notes (Signed)
Patient left the dayroom and returned to his room. Remained restless and demanding but redirectable. Frequently calling staff, complaining of many things at the same time and not willing to listen. Patient asked for coffee. Was encouraged to express his needs and concerns appropriately. Staff continue to monitor on 1:1.

## 2018-06-14 NOTE — Progress Notes (Signed)
Remains asleep. Safety maintained on 1:1.  

## 2018-06-14 NOTE — Progress Notes (Signed)
This writer observed banging on the glass part of the double doors. Upon approaching the door patient was standing by the door as if he was trying to get out of the door. Patient stated that he was tired of being here and continued to bang and hit the glass part of the door with his fists. At this time, security and other staff members enters the unit as a safety measure. Patient continues to yell and curse at staff stating, "If any of y'all touch me you are going to get fucked up." Patient repeated this statement several times. Verbal de-escalation tactic attempted but was not successful. Patient then walked into his room and wrapped a pillow case around his head and stated, "I'm just going to choke myself then." Patient was asked to remove the pillow case and he did so. Patient stated that he was going to take anymore medication here until we let go back to his group home. Patient then reminded of the conversation that he had with the MD and the behavior that is expected before discharge would occur. Patient refused to listen to staff, but finally agreed to sit down in the restraint chair. Circulation and ROM to all four extremities checked after being placed in chair. An order for Thorazine ordered and administered. Patient remained agitated and threatening after being placed in chair stating, "When I get out of here I'm going to fuck every last one of y'all up. Where is that fucking doctor? When I  see her I'm going to punch her in her face!"  Patient remained agitated for about an hour, patient offered food/fluid and elimination while being in the the restraint chair. Patient calm at 1524 and agreed to remain calm and safe. Patient released from restraint at 1524. Administration and patient's legal guardian notified.

## 2018-06-14 NOTE — Plan of Care (Signed)
Patient is able to remain safe while on the unit with intervention from staff to prevent self harm and harm to others. Pt. Is on continued 1:1 with continued monitoring with security as well. Pt. Refuses patient education. Pt. Continues to present aggravated frequently, agitated, and irritable. Pt. Interactions with staff and peers frequently not appropriate and requires redirection from inappropriate behaviors.   Problem: Education: Goal: Knowledge of [place] [facility] Education information/materials will improve Outcome: Not Progressing Goal: Emotional status will improve Outcome: Not Progressing Goal: Mental status will improve Outcome: Not Progressing   Problem: Coping: Goal: Ability to interact with others will improve Outcome: Not Progressing   Problem: Safety: Goal: Ability to remain free from injury will improve Outcome: Progressing

## 2018-06-14 NOTE — Plan of Care (Signed)
Patient's affect and mood is not progressing at this time, continues to exhibit aggressive behavior that requires medicinal intervention.

## 2018-06-14 NOTE — Progress Notes (Signed)
Patient ID: Christian Alexander, male   DOB: 2001-05-23, 17 y.o.   MRN: [id number]  Patient received Thorazine 100 mg IM for agitation. Unable to urinate. This has happened in the past. He may self cath.

## 2018-06-14 NOTE — [facility] Group Notes (Signed)
LCSW Group Therapy Note  06/14/2018 1:00 pm  Type of Therapy/Topic:  Group Therapy:  Balance in Life  Participation Level:  Did Not Attend  Description of Group:    This group will address the concept of balance and how it feels and looks when one is unbalanced. Patients will be encouraged to process areas in their lives that are out of balance and identify reasons for remaining unbalanced. Facilitators will guide patients in utilizing problem-solving interventions to address and correct the stressor making their life unbalanced. Understanding and applying boundaries will be explored and addressed for obtaining and maintaining a balanced life. Patients will be encouraged to explore ways to assertively make their unbalanced needs known to significant others in their lives, using other group members and facilitator for support and feedback.  Therapeutic Goals: 1. Patient will identify two or more emotions or situations they have that consume much of in their lives. 2. Patient will identify signs/triggers that life has become out of balance:  3. Patient will identify two ways to set boundaries in order to achieve balance in their lives:  4. Patient will demonstrate ability to communicate their needs through discussion and/or role plays  Summary of Patient Progress:      Therapeutic Modalities:   Cognitive Behavioral Therapy Solution-Focused Therapy Assertiveness Training  SHLOMA ROGGENKAMP, LCSW 06/14/2018 3:20 PM

## 2018-06-14 NOTE — Progress Notes (Signed)
Safety zone file number is [id number].

## 2018-06-14 NOTE — BH Assessment (Signed)
Requested information (Psych MD Notes) faxed to [facility] [phone]) and confirmed it was received. Information pending review.  Fax: [phone] / [phone]  Per EJ, no male beds available.

## 2018-06-14 NOTE — Progress Notes (Signed)
Patient at this time observed with 1:1 present and security for safety. Patient is sleeping in his room at this time with unlabored respirations and no distress noted. No behavioral issues to report at this time. Will continue to monitor for safety.

## 2018-06-14 NOTE — Progress Notes (Signed)
Patient at this time is observed by staff in his room attempting to write in his journal with a crayon, but became severely agitated with having to use a crayon to write for safety reasons and proceeded to start ripping pieces of paper out of the journal and throwing them onto the floor aggressively and cursing loudly to himself. Patient at this time given support and encouragement to comfort the patient, but requests IM GEODON to calm down. Patient given IM Geodon for comfort of the patient and safety. Pt. Was complaint with administration of medications for comfort and safety. Staff will continue to monitor per orders for safety.

## 2018-06-14 NOTE — BH Assessment (Signed)
Requested information (Psych MD Notes) faxed to [facility] [phone]) and confirmed it was received. Information pending review.  Fax: [phone] / [phone]  Per Nada Boozer, RN, patient has been placed on "Priority Waitlist"
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
## 2018-06-14 NOTE — Progress Notes (Addendum)
D: Upon arrival to unit pt. Resting in his room, but a short time later was observed journaling in his room by staff and this Clinical research associate. Pt. Prior to assessments had an outburst while journaling of severely aggressive behavior in his room and required IM PRN medications for agitations that the patient requested and was given for safety and comfort with patient complaince. On call MD notified of this behavioral incident. Pt. After sometime for PRN medications to reduce agitations was later assessed by this RN. During assessments by this writer patient was calmer, denied current suicidal thoughts and ideation and denied thoughts of hurting others on the unit. Pt. Denied to this writer hearing voices or having visual hallucinations currently. Pt. Denied pain. Pt. Engagement during assessment was minimal, forwarding little, and eye contact brief. Pt. Was sleeping just prior to my assessments and was reluctant to participate in answering this RN's questions, but engaged a small amount. Pt. Mood is frequently aggravated and bursts of irritability and agitations frequent, requiring PRN medications to maintain control. Pt. Not able to utilize coping skills and refuses to attempt to utilize coping skills and requests PRN medications to maintain control.    A: Q x 15 minute observation checks were completed for safety. Patient was provided with education, but refuses.  Patient was given/offered medications per orders. Patient  was encourage to attend groups, participate in unit activities and continue with plan of care. Pt. Chart and plans of care reviewed. Pt. Given support and encouragement.   R: Patient is complaint with medication with direction and encouragement from staff. Pt. Educated to come to staff about agitations and anxieties before patient is in a state of crisis and needing to have physical intervention to prevent self harm or violent behaviors towards others. Pt. Verbalizes understanding of this need to come  to staff for safety. Pt. This evening able to verbalize the need for PRN medications for safety and comfort and are given with pt. Compliance. Pt. Attempted to be educated on high falls risks safety, but refuses. During evening hand-off report this Clinical research associate was notified by RN and BHT that patient voided a moderate amount of urine just prior to report. MD notified of pt. Able to void.   Patient Observation @0315am   Pt. Able to void at this time roughly of urine into toilet.    Patient Observation @0500am    Pt. At this time awake in his room expressing agitations and anxiety getting aggravated. Pt. Was given support and encouragement to explore the root cause of agitations with no reported reason given from patient. Pt. Educated that he needs to try to utilize copying skills, but refused. Pt. Not interested in attempting to utilize coping skills and is adamant about being pharmacologically assisted to managed his agitations. Pt. 1:1 present at this time with security present on unit for safety.            Precautionary checks every 15 minutes for safety maintained, 1:1 BHT for safety maintained, security guard 1:1 maintained, room free of safety hazards with room search, patient sustains no injury or falls during this shift. Will endorse care to next shift.

## 2018-06-14 NOTE — Progress Notes (Signed)
Patient remains asleep, sitter at bedside. No sign of distress 
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
## 2018-06-14 NOTE — Progress Notes (Signed)
Patient went to his room but unable to rest. Called RN reporting that "I am bored, there is nothing I can do...". Saying that he does not have anything to watch on TV. Currently sitting in the dayroom and remains 1:1. Safety precautions maintained.

## 2018-06-14 NOTE — Progress Notes (Signed)
Patient is observed resting at this time with 1:1 present for safety. No behavioral issues at this time 1900pm. Will continue to monitor for safety per orders.

## 2018-06-14 NOTE — Progress Notes (Signed)
Recreation Therapy Notes   Date: 06/14/2018  Time: 9:30 am   Location: Craft room   Behavioral response: N/A   Intervention Topic: Problem Solving  Discussion/Intervention: Patient did not attend group.   Clinical Observations/Feedback:  Patient did not attend group.   Sante Biedermann LRT/CTRS         Tobin Cadiente 06/14/2018 10:31 AM

## 2018-06-14 NOTE — Progress Notes (Signed)
Patient at this time observed in his room asleep with unlabored respirations. Pt. Assessed and is in no distress. Pt. Is observed with 1:1 present as well as security guard for safety.

## 2018-06-14 NOTE — Progress Notes (Signed)
Patient woke up agitated, complaining of voices that tell him to hurt himself. Received Haldol 10mg  by mouth as prescribed. Contracted for safety. Currently sitting in the hallway, sitter monitoring on 1:1.

## 2018-06-15 ENCOUNTER — Other Ambulatory Visit: Payer: Self-pay

## 2018-06-15 ENCOUNTER — Inpatient Hospital Stay: Payer: Medicaid Other

## 2018-06-15 LAB — TROPONIN I

## 2018-06-15 LAB — GLUCOSE, CAPILLARY: GLUCOSE-CAPILLARY: 92 mg/dL (ref 70–99)

## 2018-06-15 MED ORDER — CHLORPROMAZINE HCL 25 MG/ML IJ SOLN
50.0000 mg | Freq: Three times a day (TID) | INTRAMUSCULAR | Status: DC | PRN
  Administered 2018-06-17 – 2018-06-20 (×2): 50 mg via INTRAMUSCULAR
  Filled 2018-06-15 (×3): qty 2

## 2018-06-15 MED ORDER — CLOZAPINE 25 MG PO TABS
25.0000 mg | ORAL_TABLET | Freq: Every day | ORAL | Status: AC
  Administered 2018-06-15: 25 mg via ORAL
  Filled 2018-06-15: qty 1

## 2018-06-15 MED ORDER — CLOZAPINE 25 MG PO TABS: 50.0000 mg | ORAL_TABLET | Freq: Every day | ORAL | Status: DC

## 2018-06-15 MED ORDER — OXCARBAZEPINE 300 MG PO TABS
300.0000 mg | ORAL_TABLET | Freq: Two times a day (BID) | ORAL | Status: DC
  Administered 2018-06-15 – 2018-06-20 (×10): 300 mg via ORAL
  Filled 2018-06-15 (×10): qty 1

## 2018-06-15 NOTE — Progress Notes (Signed)
Patient at this time (03:15am) observed with 1:1 BHT present to void roughly . Pt. Proceeds to return to bed to sleep afterwards.

## 2018-06-15 NOTE — Progress Notes (Signed)
Patient at this time observed with 1:1 present and security for safety. Patient is sleeping in his room at this time with unlabored respirations and no distress noted. No behavioral issues to report at this time. Will continue to monitor for safety.   
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
## 2018-06-15 NOTE — Consult Note (Signed)
Psychiatry: Follow-up note.  Head CT was performed and was completely normal.  No sign of intracranial bleed or mass.  By the time he got back from his head CT he was awake alert sitting up and eating.  At this point I do not think there is need for any other special medical concern about his condition.  Continue treatment as previously ordered.

## 2018-06-15 NOTE — Progress Notes (Signed)
Pt. Awake at this time in his room then out to the hallway to have his vital signs complete. Pt. Compliant with vital signs this morning. 1:1 BHT present with security for safety.

## 2018-06-15 NOTE — [facility] Group Notes (Signed)
[facility] Group Notes:  (Nursing/MHT/Case Management/Adjunct)  Date:  06/15/2018  Time:  11:10 PM  Type of Therapy:  Group Therapy  Participation Level:  Did Not Attend  Participation Quality:  Pt on back hall  Summary of Progress/Problems:  Christian Alexander 06/15/2018, 11:10 PM

## 2018-06-15 NOTE — Progress Notes (Signed)
Patient Safety:  0800: Patient in bed resting with eyes open  with safety sitter and security in place. 0900: Patient in dayroom eating his breakfast, safety sitter and security in place. 1000: Patient in bed resting with eyes closed, safety sitter and security in place. 1100: Patient in dayroom talking to MHT, no distress noted. Security in place. 1200: Patient in dayroom on the green hall eating his lunch. Sitter and security in place. 1300: Patient in his room asleep at this time. Database administrator in place. 1400: Patient in the hallway communicating with Recruitment consultant and security. No distress noted. 1500: Patient outside playing basketball, Recruitment consultant and security. Complained of his heat beating fast. EKG done. Patient sinus tachycardic with NSR. 1600: Patient became unresponsive around 1515, unlabored breathing, palpable pulse, pupils reactive to light, patient observed swallowing, unresponsive to pain, BP WNL, pulse elevated at 119 and O2 Sat 98% on room air. Rapid response initiated. Dr. Hinton Dyer notified and hospitalist contacted for further assessment. Dr. Toni Amend in for assessment. Order received for patient to have stat CT scan of head done per Danella Maiers, MD. 1700: Patient off unit to have CT scan done of his head Patient back on the unit at 1730.    1800: Patient back on the unit at 1730, results normal from scan. Patient up walking around his room, demanding to go up to the medical floor. Patient in dayroom eating his dinner, patient put the plastic bag from his try into his mouth attempting to swallow it. Staff asked patient to remove it from his mouth. After several minutes of speaking with patient the plastic was given to staff. Patient went to his room and started banging his head against his wall stating, "I'm going to go upstairs to the medical floor. I was suppose to have open heart surgery back in October." Patient asked by staff to stop banging his head,  pt stopped and laid on the bed. 1815: This Clinical research associate in patient's room speaking with him regarding the order that the MD put in for a Troponin level draw. Patient continued to speak on going up to the medical floor. States, "I can go up there, I bet I can get off of that unit." 1900- Patient  Sitting in day room interacting with staff members. Calm at this time. Will inform oncoming shift of the aforemented

## 2018-06-15 NOTE — Consult Note (Signed)
  Psychiatry: I was called by Dr. Demetrius Charity who had already left for the day to assist in taking a look at this 17 year old man who had a change in his mental status.  Nursing reports that for more than an hour the patient has been unconscious and they have not been able to wake him up even with attempts at sternal rubs.  Despite this his blood pressure is fine.  He is tachycardic but he has been tachycardic consistently for a while.  His oxygenation status appears normal.  Blood sugar unremarkable.  Patient did not have a fall and there is no sign of any injury.  He appears to be breathing comfortably.  Pupils equal and reactive.  Hospitalist was contacted and has requested a head CT which is currently being performed.  I will follow-up on the results of that.  Given the clinical situation it seems most likely that this is simply the patient being deeply asleep given the multiple medicines he is taking but I will follow up as noted.

## 2018-06-15 NOTE — [facility] Group Notes (Signed)
Feelings Around Relapse 06/15/2018 1PM  Type of Therapy and Topic:  Group Therapy:  Feelings around Relapse and Recovery  Participation Level:  Did Not Attend   Description of Group:    Patients in this group will discuss emotions they experience before and after a relapse. They will process how experiencing these feelings, or avoidance of experiencing them, relates to having a relapse. Facilitator will guide patients to explore emotions they have related to recovery. Patients will be encouraged to process which emotions are more powerful. They will be guided to discuss the emotional reaction significant others in their lives may have to patients' relapse or recovery. Patients will be assisted in exploring ways to respond to the emotions of others without this contributing to a relapse.  Therapeutic Goals: 1. Patient will identify two or more emotions that lead to a relapse for them 2. Patient will identify two emotions that result when they relapse 3. Patient will identify two emotions related to recovery 4. Patient will demonstrate ability to communicate their needs through discussion and/or role plays   Summary of Patient Progress:  Patient was encouraged and invited to attend group. Patient did not attend group. Social worker will continue to encourage group participation in the future.      Therapeutic Modalities:   Cognitive Behavioral Therapy Solution-Focused Therapy Assertiveness Training Relapse Prevention Therapy   Suzan Slick, LCSW 06/15/2018 3:03 PM

## 2018-06-15 NOTE — BH Assessment (Signed)
Per the request of [facility] Psych MD (Dr. Karie Soda), writer called and spoke [facility] ([phone]) and confirmed they received the most recent (06/15/2018) faxed from the MD and that the patient remains on their priority wait list. [facility] states they are unable to give where he is at on the wait list.

## 2018-06-15 NOTE — Plan of Care (Signed)
Patient remains irritable and agitated at this time. Does not verbalize understanding of any education provided to patient.

## 2018-06-15 NOTE — Progress Notes (Signed)
Recreation Therapy Notes  Date: 06/15/2018  Time: 9:30 am   Location: Craft room   Behavioral response: N/A   Intervention Topic: Leisure   Discussion/Intervention: Patient did not attend group.   Clinical Observations/Feedback:  Patient did not attend group.   Egor Fullilove LRT/CTRS        Durene Dodge 06/15/2018 10:54 AM 
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
## 2018-06-15 NOTE — H&P (Signed)
[employer] [facility] [place] at [facility]   PATIENT NAME: Christian Alexander    MR#:  [id number]  DATE OF BIRTH:  07-Jun-2001  DATE OF ADMISSION:  06/08/2018  PRIMARY CARE PHYSICIAN: Pavelock, Duke Salvia, MD   REQUESTING/REFERRING PHYSICIAN:   CHIEF COMPLAINT:  No chief complaint on file.   HISTORY OF PRESENT ILLNESS: Christian Alexander  is a 17 y.o. male with a known history per below, admitted to psychiatry for acute psychosis with agitation, earlier today patient had complained of some chest pain, subsequently became unresponsive/unarousable, hospitalist asked to evaluate, EKG noted for sinus tachycardia, CT head negative for any acute process, troponin was negative, patient evaluated-currently ambulating in the room without difficulty, patient currently denies chest pain/shortness of breath, no focal motor deficits noted, patient is brightly awake and alert, patient now being evaluated for resolved acute comatose state and atypical chest pain.  PAST MEDICAL HISTORY:   Past Medical History:  Diagnosis Date  . Aortic valve stenosis   . Attention deficit disorder (ADD)   . ODD (oppositional defiant disorder)     PAST SURGICAL HISTORY:  Past Surgical History:  Procedure Laterality Date  . CARDIAC SURGERY      SOCIAL HISTORY:  Social History   Tobacco Use  . Smoking status: Current Some Day Smoker    Packs/day: 1.00    Types: Cigarettes  . Smokeless tobacco: Never Used  Substance Use Topics  . Alcohol use: No    FAMILY HISTORY: History reviewed. No pertinent family history.  DRUG ALLERGIES: No Known Allergies  REVIEW OF SYSTEMS: Poor historian due to mental illness  CONSTITUTIONAL: No fever, fatigue or weakness.  EYES: No blurred or double vision.  EARS, NOSE, AND THROAT: No tinnitus or ear pain.  RESPIRATORY: No cough, shortness of breath, wheezing or hemoptysis.  CARDIOVASCULAR: No chest pain, orthopnea, edema.  GASTROINTESTINAL: No nausea, vomiting, diarrhea or  abdominal pain.  GENITOURINARY: No dysuria, hematuria.  ENDOCRINE: No polyuria, nocturia,  HEMATOLOGY: No anemia, easy bruising or bleeding SKIN: No rash or lesion. MUSCULOSKELETAL: No joint pain or arthritis.   NEUROLOGIC: No tingling, numbness, weakness.  PSYCHIATRY: No anxiety or depression.   MEDICATIONS AT HOME:  Prior to Admission medications   Medication Sig Start Date End Date Taking? Authorizing Provider  naltrexone (DEPADE) 50 MG tablet Take 50 mg by mouth daily. 04/30/18   [provider]  Oxcarbazepine (TRILEPTAL) 300 MG tablet Take 300-900 mg by mouth See admin instructions. Take 1 tablet (300MG ) by mouth every morning and lunchtime and take 3 tablets (900MG ) by mouth every night at bedtime 04/30/18   [provider]  QUEtiapine (SEROQUEL) 100 MG tablet Take 50-200 mg by mouth See admin instructions. Take  tablet (50MG ) by mouth every morning and 2 tablets (200MG ) by mouth every night at bedtime 04/30/18   [provider]  tamsulosin (FLOMAX) 0.4 MG CAPS capsule Take 0.4 mg by mouth.    [provider]      PHYSICAL EXAMINATION:   VITAL SIGNS: Blood pressure 121/63, pulse (!) 122, temperature 98.5 F (36.9 C), temperature source Oral, resp. rate 17, height 5' 2.01" (1.575 m), weight 49 kg, SpO2 98 %.  GENERAL:  17 y.o.-year-old patient lying in the bed with no acute distress.  EYES: Pupils equal, round, reactive to light and accommodation. No scleral icterus. Extraocular muscles intact.  HEENT: Head atraumatic, normocephalic. Oropharynx and nasopharynx clear.  NECK:  Supple, no jugular venous distention. No thyroid enlargement, no tenderness.  LUNGS: Normal breath  sounds bilaterally, no wheezing, rales,rhonchi or crepitation. No use of accessory muscles of respiration.  CARDIOVASCULAR: S1, S2 normal. No murmurs, rubs, or gallops.  ABDOMEN: Soft, nontender, nondistended. Bowel sounds present. No organomegaly or mass.  EXTREMITIES: No pedal  edema, cyanosis, or clubbing.  NEUROLOGIC: Cranial nerves II through XII are intact. Muscle strength 5/5 in all extremities. Sensation intact. Gait not checked.  PSYCHIATRIC: The patient is alert and oriented, appears agitated/restless, noted pacing in the room  SKIN: No obvious rash, lesion, or ulcer.   LABORATORY PANEL:   CBC Recent Labs  Lab 06/13/18 1430  WBC 10.2  HGB 14.8  HCT 42.3  PLT 160  MCV 85.3  MCH 29.8  MCHC 35.0  RDW 13.8  LYMPHSABS 1.9  MONOABS 1.3*  EOSABS 0.1  BASOSABS 0.1   ------------------------------------------------------------------------------------------------------------------  Chemistries  No results for input(s): NA, K, CL, CO2, GLUCOSE, BUN, CREATININE, CALCIUM, MG, AST, ALT, ALKPHOS, BILITOT in the last 168 hours.  Invalid input(s): GFRCGP ------------------------------------------------------------------------------------------------------------------ estimated creatinine clearance is 102.9 mL/min (by C-G formula based on SCr of 0.66 mg/dL). ------------------------------------------------------------------------------------------------------------------ No results for input(s): TSH, T4TOTAL, T3FREE, THYROIDAB in the last 72 hours.  Invalid input(s): FREET3   Coagulation profile No results for input(s): INR, PROTIME in the last 168 hours. ------------------------------------------------------------------------------------------------------------------- No results for input(s): DDIMER in the last 72 hours. -------------------------------------------------------------------------------------------------------------------  Cardiac Enzymes Recent Labs  Lab 06/15/18 1731  TROPONINI <0.03   ------------------------------------------------------------------------------------------------------------------ Invalid input(s):  POCBNP  ---------------------------------------------------------------------------------------------------------------  Urinalysis    Component Value Date/Time   COLORURINE STRAW (A) 12/11/2015 1220   APPEARANCEUR CLEAR (A) 12/11/2015 1220   LABSPEC 1.010 12/11/2015 1220   PHURINE 7.0 12/11/2015 1220   GLUCOSEU NEGATIVE 12/11/2015 1220   HGBUR NEGATIVE 12/11/2015 1220   BILIRUBINUR NEGATIVE 12/11/2015 1220   KETONESUR NEGATIVE 12/11/2015 1220   PROTEINUR NEGATIVE 12/11/2015 1220   UROBILINOGEN 0.2 08/15/2014 1432   NITRITE NEGATIVE 12/11/2015 1220   LEUKOCYTESUR NEGATIVE 12/11/2015 1220     RADIOLOGY: Ct Head Wo Contrast  Result Date: 06/15/2018 CLINICAL DATA:  17 year old male with a history of unresponsiveness EXAM: CT HEAD WITHOUT CONTRAST TECHNIQUE: Contiguous axial images were obtained from the base of the skull through the vertex without intravenous contrast. COMPARISON:  None. FINDINGS: Brain: No acute intracranial hemorrhage. No midline shift or mass effect. Gray-white differentiation maintained. Unremarkable appearance of the ventricular system. Vascular: Unremarkable. Skull: No acute fracture.  No aggressive bone lesion identified. Sinuses/Orbits: Unremarkable appearance of the orbits. Mastoid air cells clear. No middle ear effusion. No significant sinus disease. Other: None IMPRESSION: Negative head CT Electronically Signed   By: Gilmer Mor D.O.   On: 06/15/2018 17:01    EKG: Orders placed or performed during the hospital encounter of 06/08/18  . EKG 12-Lead  . EKG 12-Lead  . EKG 12-Lead  . EKG 12-Lead  . EKG 12-Lead  . EKG 12-Lead  . EKG 12-Lead  . EKG 12-Lead    IMPRESSION AND PLAN: *Acute unresponsiveness/comatose state Resolved  *Acute atypical chest pain EKG and troponins are negative Continue conservative observation/medical monitoring Will check chest x-ray for completeness Suspect may be due to underlying mental illness  *Acute psychosis with  agitation Plan of care per primary service  We will continue to follow with you for the next day or so, please call for any concerns   All the records are reviewed and case discussed with ED provider. Management plans discussed with the patient, family and they are in agreement.  CODE STATUS:full    Code  Status Orders  (From admission, onward)         Start     Ordered   06/08/18 1609  Full code  Continuous     06/08/18 1608        Code Status History    Date Active Date Inactive Code Status Order ID Comments User Context   08/15/2014 1307 08/20/2014 1755 Full Code [id number]  Chauncey Mann, MD Inpatient   08/13/2014 1536 08/15/2014 1307 Full Code [id number]  Mardene Celeste, RN ED       TOTAL TIME TAKING CARE OF THIS PATIENT: 35 minutes.    Evelena Asa Salary M.D on 06/15/2018   Between 7am to 6pm - Pager - [phone]  After 6pm go to [email or web address] - password [email or web address]  [facility]  Office  [phone]  CC: Primary care physician; Gilda Crease, MD   Note: This dictation was prepared with Dragon dictation along with smaller phrase technology. Any transcriptional errors that result from this process are unintentional.

## 2018-06-15 NOTE — Progress Notes (Signed)
Patient at this time observed with 1:1 present and security for safety. Patient is sleeping in his room at this time with unlabored respirations and no distress noted.No behavioral issues to report at this time. Will continue to monitor for safety.

## 2018-06-15 NOTE — Progress Notes (Signed)
Clozapine monitoring  ANC 6.6. Lab submitted and patient is eligible to receive per [facility] website.  Fulton Reek, PharmD, BCPS  06/15/18 1:01 AM
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
## 2018-06-15 NOTE — Tx Team (Signed)
Interdisciplinary Treatment and Diagnostic Plan Update  06/15/2018 Time of Session: 8:30am Christian Alexander MRN: [id number]  Principal Diagnosis: Bipolar I disorder, most recent episode depressed, severe without psychotic features (HCC)  Secondary Diagnoses: Principal Problem:   Bipolar I disorder, most recent episode depressed, severe without psychotic features (HCC) Active Problems:   Suicidal ideation   Status post aortic valve replacement using Ross procedure   Current Medications:  Current Facility-Administered Medications  Medication Dose Route Frequency Provider Last Rate Last Dose  . acetaminophen (TYLENOL) tablet 650 mg  650 mg Oral Q6H PRN Clapacs, Jackquline Denmark, MD   650 mg at 06/10/18 2256  . ALPRAZolam (XANAX) tablet 1 mg  1 mg Oral BID Pucilowska, Jolanta B, MD   1 mg at 06/15/18 0814  . alum & mag hydroxide-simeth (MAALOX/MYLANTA) 200-200-20 MG/5ML suspension 30 mL  30 mL Oral Q4H PRN Clapacs, John T, MD      . chlorproMAZINE (THORAZINE) injection 100 mg  100 mg Intramuscular TID PRN Pucilowska, Jolanta B, MD   100 mg at 06/14/18 1429  . cloZAPine (CLOZARIL) tablet 25 mg  25 mg Oral QHS Pucilowska, Jolanta B, MD      . Melene Muller ON 06/16/2018] cloZAPine (CLOZARIL) tablet 50 mg  50 mg Oral QHS Pucilowska, Jolanta B, MD      . haloperidol (HALDOL) tablet 10 mg  10 mg Oral Q6H PRN Pucilowska, Jolanta B, MD   10 mg at 06/15/18 0507  . LORazepam (ATIVAN) tablet 2 mg  2 mg Oral Q6H PRN Pucilowska, Jolanta B, MD   2 mg at 06/15/18 0507  . magnesium hydroxide (MILK OF MAGNESIA) suspension 30 mL  30 mL Oral Daily PRN Clapacs, John T, MD      . nicotine polacrilex (NICORETTE) gum 2 mg  2 mg Oral PRN Pucilowska, Jolanta B, MD   2 mg at 06/11/18 1400  . NON FORMULARY 1 Inhaler  1 Inhaler Oral Daily PRN Pucilowska, Jolanta B, MD      . Oxcarbazepine (TRILEPTAL) tablet 300 mg  300 mg Oral BID Pucilowska, Jolanta B, MD   300 mg at 06/15/18 0814  . QUEtiapine (SEROQUEL) tablet 50 mg  50 mg Oral QHS  Pucilowska, Jolanta B, MD   50 mg at 06/14/18 2150  . tamsulosin (FLOMAX) capsule 0.4 mg  0.4 mg Oral QPC breakfast Pucilowska, Jolanta B, MD   0.4 mg at 06/15/18 0814  . temazepam (RESTORIL) capsule 30 mg  30 mg Oral QHS PRN Pucilowska, Jolanta B, MD      . ziprasidone (GEODON) injection 20 mg  20 mg Intramuscular BID PRN Pucilowska, Jolanta B, MD   20 mg at 06/15/18 0641   PTA Medications: Medications Prior to Admission  Medication Sig Dispense Refill Last Dose  . naltrexone (DEPADE) 50 MG tablet Take 50 mg by mouth daily.  0   . Oxcarbazepine (TRILEPTAL) 300 MG tablet Take 300-900 mg by mouth See admin instructions. Take 1 tablet (300MG ) by mouth every morning and lunchtime and take 3 tablets (900MG ) by mouth every night at bedtime  0   . QUEtiapine (SEROQUEL) 100 MG tablet Take 50-200 mg by mouth See admin instructions. Take  tablet (50MG ) by mouth every morning and 2 tablets (200MG ) by mouth every night at bedtime  0   . tamsulosin (FLOMAX) 0.4 MG CAPS capsule Take 0.4 mg by mouth.   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   Physician Treatment Plan for Primary Diagnosis: Bipolar I disorder, most recent episode depressed, severe without psychotic features (HCC) Long Term Goal(s): Improvement in symptoms so as ready for discharge Improvement in symptoms so as ready for discharge   Short Term Goals: Ability to identify changes in lifestyle to reduce recurrence of condition will improve Ability to verbalize feelings will improve Ability to disclose and discuss suicidal ideas Ability to demonstrate self-control will improve Ability to identify and develop effective coping behaviors will improve Ability to maintain clinical  measurements within normal limits will improve Compliance with prescribed medications will improve Ability to identify triggers associated with substance abuse/mental health issues will improve NA  Medication Management: Evaluate patient's response, side effects, and tolerance of medication regimen.  Therapeutic Interventions: 1 to 1 sessions, Unit Group sessions and Medication administration.  Evaluation of Outcomes: Not Progressing  Physician Treatment Plan for Secondary Diagnosis: Principal Problem:   Bipolar I disorder, most recent episode depressed, severe without psychotic features (HCC) Active Problems:   Suicidal ideation   Status post aortic valve replacement using Ross procedure  Long Term Goal(s): Improvement in symptoms so as ready for discharge Improvement in symptoms so as ready for discharge   Short Term Goals: Ability to identify changes in lifestyle to reduce recurrence of condition will improve Ability to verbalize feelings will improve Ability to disclose and discuss suicidal ideas Ability to demonstrate self-control will improve Ability to identify and develop effective coping behaviors will improve Ability to maintain clinical measurements within normal limits will improve Compliance with prescribed medications will improve Ability to identify triggers associated with substance abuse/mental health issues will improve NA     Medication Management: Evaluate patient's response, side effects, and tolerance of medication regimen.  Therapeutic Interventions: 1 to 1 sessions, Unit Group sessions and Medication administration.  Evaluation of Outcomes: Not Progressing   RN Treatment Plan for Primary Diagnosis: Bipolar I disorder, most recent episode depressed, severe without psychotic features (HCC) Long Term Goal(s): Knowledge of disease and therapeutic regimen to maintain health will improve  Short Term Goals: Ability to verbalize feelings will improve, Ability  to identify and develop effective coping behaviors will improve and Compliance with prescribed medications will improve  Medication Management: RN will administer medications as ordered by provider, will assess and evaluate patient's response and provide education to patient for prescribed medication. RN will report any adverse and/or side effects to prescribing provider.  Therapeutic Interventions: 1 on 1 counseling sessions, Psychoeducation, Medication administration, Evaluate responses to treatment, Monitor vital signs and CBGs as ordered, Perform/monitor CIWA, COWS, AIMS and Fall Risk screenings as ordered, Perform wound care treatments as ordered.  Evaluation of Outcomes: Not Progressing   LCSW Treatment Plan for Primary Diagnosis: Bipolar I disorder, most recent episode depressed, severe without psychotic features (HCC) Long Term Goal(s): Safe transition to appropriate next level of care at discharge, Engage patient in therapeutic group addressing interpersonal concerns.  Short Term Goals: Engage patient in aftercare planning with referrals and resources, Increase social support, Increase emotional regulation, Facilitate acceptance of mental health diagnosis and concerns, Identify triggers associated with mental health/substance abuse issues and Increase skills for wellness and recovery  Therapeutic Interventions: Assess for all discharge needs, 1 to 1 time with Social worker, Explore available resources and support systems, Assess for adequacy in community support network, Educate family and significant other(s) on suicide prevention, Complete Psychosocial Assessment, Interpersonal group therapy.  Evaluation of Outcomes: Not Progressing   Progress in Treatment: Attending groups: No. Participating in groups: No. Taking medication as prescribed: Yes. Toleration medication: Yes. Family/Significant other contact made: Yes, individual(s) contacted:  spoke with guardian Hilbert Odor Patient  understands diagnosis: No. Discussing patient identified problems/goals with staff: No. Medical problems stabilized or resolved: Yes. Denies suicidal/homicidal ideation: Yes. Issues/concerns per patient self-inventory: No. Other:   New problem(s) identified: No, Describe:  None  New Short Term/Long Term Goal(s): Patient did not attend treatment team.  Patient Goals:  Patient did not attend treatment team.  Discharge Plan or Barriers: To be referred to [facility] or return to his group home and follow up with outpatient treatment. No certain plan is in place at the time.  Reason for Continuation of Hospitalization: Aggression Medication stabilization  Estimated Length of Stay: 5-7 days   Recreational Therapy: Patient Stressors: N/A Patient Goal: Patient will engage in groups with a calm and appropriate mood at least 2x within 5 recreation therapy group sessions  Attendees: Patient:  06/15/2018 11:43 AM  Physician: Dr. Jennet Maduro, MD 06/15/2018 11:43 AM  Nursing: Cecille Amsterdam RN 06/15/2018 11:43 AM  RN Care Manager: 06/15/2018 11:43 AM  Social Worker: Johny Shears, LCSWA 06/15/2018 11:43 AM  Recreational Therapist:  06/15/2018 11:43 AM  Other: Lowella Dandy, LCSW 06/15/2018 11:43 AM  Other: Huey Romans, LCSW 06/15/2018 11:43 AM  Other: Corinna Gab, MD 06/15/2018 11:43 AM    Scribe for Treatment Team: Johny Shears, LCSW 06/15/2018 11:43 AM

## 2018-06-15 NOTE — Progress Notes (Signed)
Patient ID: Christian Alexander, male   DOB: Jul 07, 2001, 17 y.o.   MRN: [id number]  I reviewed notes from Dr. Toni Amend and Dr. Katheren Shams. Head CT, EKG, chest Xray, troponin negative. The patient now insists that he needs heart surgery and arues to be transferred to medical floor and is getting agitated again.   Medicine input is greatly appreciated. Will lower PRN dose of Thorazine to avoid deep sedation and risk of aspiration pneumonia.  Unfortunately, there is only temporary relief when PRN are given without any lasting effect.

## 2018-06-15 NOTE — Progress Notes (Signed)
Pt. At this time awake in his room expressing agitations and anxiety getting aggravated. Pt. Was given support and encouragement to explore the root cause of agitations with no reported reason given from patient. Pt. Educated that he needs to try to utilize copying skills, but refused. Pt. Not interested in attempting to utilize coping skills and is adamant about being pharmacologically assisted to managed his agitations. Pt. 1:1 present at this time with security present on unit for safety. Will continue to monitor per orders for safety. Oral PRN medications administered with pt. Compliance.

## 2018-06-15 NOTE — Significant Event (Signed)
Rapid Response Event Note  Overview: Time Called: 1537 Arrival Time: 1540 Event Type: Neurologic  Initial Focused Assessment: Rapid Response RN arrived in room to patient lying in bed with eyes closed surrounded by staff. Staff had been unable to arouse patient for several minutes by time of arrival. Patient had received 100 mg of thorazine IM at 15:03. Patient had been complaining of chest pain earlier and got a 12 lead EKG at 15:19 which showed ST. This RN also was unable to arouse patient either with sternal rub or nailbed pressure. Pupils 4 mm, equally round and reactive to light. Patient with no increased work of breathing, respirations calm though slightly elevated. BP 121/68, MAP 83, HR 125 ST, O2 sats 99% on room air.    Interventions: CBG 92. Patient's RN spoke with Dr. Jennet Maduro who will contact hospitalist and Dr. Toni Amend to come and assess patient. Dr. Katheren Shams, hospitalist, ordered a stat head CT. Dr. Toni Amend assessed patient in person before transfer to CT.  This RN helped transfer patient to and from CT with help of Mental Health Tech, officer, and Neurosurgeon. When arrived in CT patient's mouth opened during pull and chewed gum noted in mouth which this RN removed. Patient briefly opened eyes while in CT room but would not track staff, pupils still PERRLA.  Plan of Care (if not transferred): Oxygen saturations never dropped below 99% on room air while being observed, breathing always calm with RR in low 20s. Heart rate maintained in ST 116-130 with no ectopy. Last BP 119/65, MAP 81. While being observed patient did swallow with no prompting of staff. Would occasional move foot or hand. While would not open eyes to command or follow any commands, patient did occasional perform actions like moving his leg off of the transport monitor after being transferred from stretcher back to his bed. Current plan to stay in behavioral medicine. Staff instructed to repage rapid response  team if needed.   Event Summary: Name of Physician Notified: Dr. Jennet Maduro at 1540    at    Outcome: Stayed in room and stabalized  Event End Time: 1705  Christell Constant [facility] [place]

## 2018-06-15 NOTE — Progress Notes (Signed)
Chapter responded to a medical alert. Pt was unresponsive. No family was present. Chaplain maintain a pastoral presence with silent prayers. Chaplain was no longer needed.    06/15/18 1600  Clinical Encounter Type  Visited With Patient  Visit Type Code  Referral From Patient

## 2018-06-15 NOTE — Progress Notes (Addendum)
Post Seclusion/Restraint Episode Mini Treatment Team  Date of Seclusion or Restraint Episode: 06/15/18 Today's Date:  06/15/2018  List of Patient Triggers/Skill Deficits: Poor ability to communicate. Poor ability to utilize communication skills. Patient triggers are being institutionalized. Pt. Unable and unwilling as stated by the patient to utilize coping skills. Pt. States, "I just want to be medicated".   Review of Medications:  Current Facility-Administered Medications  Medication Dose Route Frequency Provider Last Rate Last Dose  . acetaminophen (TYLENOL) tablet 650 mg  650 mg Oral Q6H PRN Clapacs, Jackquline Denmark, MD   650 mg at 06/10/18 2256  . ALPRAZolam (XANAX) tablet 1 mg  1 mg Oral BID Pucilowska, Jolanta B, MD   1 mg at 06/14/18 1733  . alum & mag hydroxide-simeth (MAALOX/MYLANTA) 200-200-20 MG/5ML suspension 30 mL  30 mL Oral Q4H PRN Clapacs, John T, MD      . chlorproMAZINE (THORAZINE) injection 100 mg  100 mg Intramuscular TID PRN Pucilowska, Jolanta B, MD   100 mg at 06/14/18 1429  . cloZAPine (CLOZARIL) tablet 50 mg  50 mg Oral QHS Pucilowska, Jolanta B, MD      . haloperidol (HALDOL) tablet 10 mg  10 mg Oral Q6H PRN Pucilowska, Jolanta B, MD   10 mg at 06/15/18 0507  . LORazepam (ATIVAN) tablet 2 mg  2 mg Oral Q6H PRN Pucilowska, Jolanta B, MD   2 mg at 06/15/18 0507  . magnesium hydroxide (MILK OF MAGNESIA) suspension 30 mL  30 mL Oral Daily PRN Clapacs, John T, MD      . nicotine polacrilex (NICORETTE) gum 2 mg  2 mg Oral PRN Pucilowska, Jolanta B, MD   2 mg at 06/11/18 1400  . NON FORMULARY 1 Inhaler  1 Inhaler Oral Daily PRN Pucilowska, Jolanta B, MD      . Oxcarbazepine (TRILEPTAL) tablet 300 mg  300 mg Oral BID Pucilowska, Jolanta B, MD      . QUEtiapine (SEROQUEL) tablet 50 mg  50 mg Oral QHS Pucilowska, Jolanta B, MD   50 mg at 06/14/18 2150  . tamsulosin (FLOMAX) capsule 0.4 mg  0.4 mg Oral QPC breakfast Pucilowska, Jolanta B, MD   0.4 mg at 06/14/18 0806  . temazepam  (RESTORIL) capsule 30 mg  30 mg Oral QHS PRN Pucilowska, Jolanta B, MD      . ziprasidone (GEODON) injection 20 mg  20 mg Intramuscular BID PRN Pucilowska, Jolanta B, MD   20 mg at 06/15/18 0641    Compliant with Medications:  Yes  Need for Medication Adjustment:  Yes  Plan to Prevent Future Episodes of Seclusion and Restraint: Patient reports that he will attempt to come to staff earlier and request medications, so that he is able to attempt to remain calm and not get agitated, requiring physical interventions and manual holds.    Staff Present:  Physician  Dr. Jennet Maduro, MD  Nurse Practitioner/PA   Pharmacist   Nurse  Aurther Loft, RN  Nurse  Delorse Limber, RN  Nurse  Molli Hazard, RN  MHT/NT  Virl Son  Counselor/Case Manager   Department Leadership  [place], House Supervisor       Lenox Ponds 06/15/2018, 7:31 AM

## 2018-06-15 NOTE — Progress Notes (Signed)
Patient currently in room, sitter at bedside. Continues to claim that he needs to be transferred to a medical unit. Agitated and asking to speak to "sergent". States that "if y'all don't send me to the medical unit, something bad is gonna happen...Marland KitchenMarland Kitchenthere is a reason why I am scheduled to have a heart surgery, I don't need to be here". Otilio Jefferson being contacted per patient request.

## 2018-06-15 NOTE — Progress Notes (Signed)
Patient went to the bathroom and came out complaining of lightheadedness. Went to bed and refused to communicate. BP 113/59. Pulse 113. Respirations within normal limits. RN continues to monitor.

## 2018-06-16 NOTE — Progress Notes (Signed)
1:1 Present. Patient is very agitated, has taken shirt off and is posturing to staff. Patient states multiple times "you holding hostage, I want to go." Patient is deescalated verbally.

## 2018-06-16 NOTE — Progress Notes (Signed)
Patient has 1:1 present. Patient is kicking door and is agitated. Patient states that the "chinese doctor didn't listen to me." Patient reports increased anxiety and agitation. Patient is redirected, patient is educated on breathing techniques. Patient is responsive to redirection and education on skills. Patient will continue to be monitored.

## 2018-06-16 NOTE — Progress Notes (Signed)
At 2027: Patient called this writer to inform her that he is ready to get out of the chair, that he will remain cooperative. Patient's mental status was assessed . Behavioral improvement noticed. Was taken out of the chair and was assisted to the bathroom. Patient voided and reported that he is experiencing constipation, requesting something to ease it.  Received his bedtime medication  (Seroquel and Restoril) and Milk of Magnesia. Received snacks and was informed that he will be NPO tonight  to prepare for upcoming  ECT.  2118: Patient in bed and has just fallen asleep. Eyes closed, respirations within normal limits. No sign of distress. Monitored on 1:1 to reinforce safety.

## 2018-06-16 NOTE — Progress Notes (Signed)
1:1 is present. Patient is up and walking around hallway. Patient has frequent complaints of anxiety/aggitation.

## 2018-06-16 NOTE — Progress Notes (Signed)
Patient woke up and asked for iced water. Also complained that "I can't see through my right eye". Patient is able to open the eye. No foreign object noted. No discharges, no color change. Patient is able to move his eye to the different directions. RN will continue to monitor. Encouragements provided. Patient returned to bed. Safety monitored on 1:1.

## 2018-06-16 NOTE — Progress Notes (Addendum)
At approximately 1715 patient had swallowed "the plastic that coverers the cookies." Dr. Joseph Art made aware and orders obtained. Patient is smiling and happy during assessment. Patient has no complaints on this time. Patient 1:1 present.

## 2018-06-16 NOTE — Progress Notes (Addendum)
1:1 is present. Patient is currently outside, patient has no complaint. Patient is playing basketball.

## 2018-06-16 NOTE — Plan of Care (Signed)
Restless, irritable, threatening staff. Not compliant with treatment

## 2018-06-16 NOTE — Progress Notes (Signed)
1:1 Present. Patient is eating and is watching TV in back day room.
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
## 2018-06-16 NOTE — Progress Notes (Signed)
Remains asleep, safety sitter at bedside. No sign of distress.

## 2018-06-16 NOTE — [facility] Group Notes (Signed)
LCSW Group Therapy Note  06/16/2018 1:15pm  Type of Therapy and Topic:  Group Therapy:  Cognitive Distortions  Participation Level:  Did Not Attend   Description of Group:    Patients in this group will be introduced to the topic of cognitive distortions.  Patients will identify and describe cognitive distortions, describe the feelings these distortions create for them.  Patients will identify one or more situations in their personal life where they have cognitively distorted thinking and will verbalize challenging this cognitive distortion through positive thinking skills.  Patients will practice the skill of using positive affirmations to challenge cognitive distortions using affirmation cards.    Therapeutic Goals:  1. Patient will identify two or more cognitive distortions they have used 2. Patient will identify one or more emotions that stem from use of a cognitive distortion 3. Patient will demonstrate use of a positive affirmation to counter a cognitive distortion through discussion and/or role play. 4. Patient will describe one way cognitive distortions can be detrimental to wellness   Summary of Patient Progress:Pt was invited to attend group but chose not to attend. CSW will continue to encourage pt to attend group throughout their admission.      Therapeutic Modalities:   Cognitive Behavioral Therapy Motivational Interviewing   Aniket Paye  CUEBAS-COLON, LCSW 06/16/2018 10:33 AM

## 2018-06-16 NOTE — Progress Notes (Signed)
1:1 present. Patient is in room without complaint. Patient is sitting in chair and interacting with staff.

## 2018-06-16 NOTE — BH Assessment (Signed)
Writer faxed updated information about patient's behavior to [facility] ([phone]). Confirmed faxed was received, pending review and he remains on Priority Wait List.
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
## 2018-06-16 NOTE — Progress Notes (Signed)
1:1 Present. Patient is in room in bed and asleep at the time of this assessment. Patient had reported to 1:1, anxiety and agitation before fall asleep. Patient will be left to rest.

## 2018-06-16 NOTE — Progress Notes (Signed)
Patient found in room, sitting in restraint chair. Agitated, cursing, forcing to get out of the chair "l t me out bitch, ...". Was encouraged to work on his behavior so that he can be out of the chair. Saff continue to monitor on 1:1.

## 2018-06-16 NOTE — Progress Notes (Addendum)
[facility] [place] [facility]   PATIENT NAME: Christian Alexander    MR#:  [id number]  DATE OF BIRTH:  10/14/2000  SUBJECTIVE:  CHIEF COMPLAINT:  No chief complaint on file.  Intermittent chest pain on both side, exacerbated by movement and breathing. REVIEW OF SYSTEMS:  Review of Systems  Constitutional: Negative for chills, fever and malaise/fatigue.  HENT: Negative for sore throat.   Eyes: Negative for blurred vision and double vision.  Respiratory: Negative for cough, hemoptysis, shortness of breath, wheezing and stridor.   Cardiovascular: Positive for chest pain. Negative for palpitations, orthopnea and leg swelling.  Gastrointestinal: Negative for abdominal pain, blood in stool, diarrhea, melena, nausea and vomiting.  Genitourinary: Negative for dysuria, flank pain and hematuria.  Musculoskeletal: Negative for back pain and joint pain.  Skin: Negative for rash.  Neurological: Negative for dizziness, sensory change, focal weakness, seizures, loss of consciousness, weakness and headaches.  Endo/Heme/Allergies: Negative for polydipsia.  Psychiatric/Behavioral: Negative for depression. The patient is not nervous/anxious.     DRUG ALLERGIES:  No Known Allergies VITALS:  Blood pressure 118/64, pulse (!) 118, temperature 97.6 F (36.4 C), temperature source Oral, resp. rate 18, height 5' 2.01" (1.575 m), weight 49 kg, SpO2 99 %. PHYSICAL EXAMINATION:  Physical Exam  Constitutional: He is oriented to person, place, and time. No distress.  HENT:  Head: Normocephalic.  Mouth/Throat: Oropharynx is clear and moist.  Eyes: Pupils are equal, round, and reactive to light. Conjunctivae and EOM are normal. No scleral icterus.  Neck: Normal range of motion. Neck supple. No JVD present. No tracheal deviation present.  Cardiovascular: Normal rate, regular rhythm and normal heart sounds. Exam reveals no gallop.  No murmur heard. Pulmonary/Chest: Effort normal and  breath sounds normal. No respiratory distress. He has no wheezes. He has no rales. He exhibits tenderness.  Chest wall tenderness on palpation.  Abdominal: Soft. Bowel sounds are normal. He exhibits no distension. There is no tenderness. There is no rebound.  Musculoskeletal: Normal range of motion. He exhibits no edema or tenderness.  Neurological: He is alert and oriented to person, place, and time. No cranial nerve deficit.  Skin: No rash noted. No erythema.   LABORATORY PANEL:  Male CBC Recent Labs  Lab 06/13/18 1430  WBC 10.2  HGB 14.8  HCT 42.3  PLT 160   ------------------------------------------------------------------------------------------------------------------ Chemistries  No results for input(s): NA, K, CL, CO2, GLUCOSE, BUN, CREATININE, CALCIUM, MG, AST, ALT, ALKPHOS, BILITOT in the last 168 hours.  Invalid input(s): GFRCGP RADIOLOGY:  Dg Chest Port 1 View  Result Date: 06/15/2018 CLINICAL DATA:  Chest pain.  Decreased responsiveness today. EXAM: PORTABLE CHEST 1 VIEW COMPARISON:  PA and lateral chest 08/08/2014. FINDINGS: Lungs clear. Heart size normal. No pneumothorax or pleural effusion. Thoracolumbar scoliosis noted. IMPRESSION: No acute disease. Electronically Signed   By: Drusilla Kanner M.D.   On: 06/15/2018 19:26   ASSESSMENT AND PLAN:   *Acute unresponsiveness/comatose state Resolved  *Acute atypical chest pain, due to muscle skeletal etiology. EKG and troponins are negative, chest x-ray is unremarkable. Pain control and supportive care.  *Acute psychosis with agitation Plan of care per psychiatrist.  Medically stable, sign off. All the records are reviewed and case discussed with Care Management/Social Worker. Management plans discussed with the patient, family and they are in agreement.  CODE STATUS: Full Code  TOTAL TIME TAKING CARE OF THIS PATIENT: 20 minutes.   More than 50% of the time was spent in counseling/coordination of care:  YES  POSSIBLE D/C IN ? DAYS, DEPENDING ON CLINICAL CONDITION.   Shaune Pollack M.D on 06/16/2018 at 5:45 PM  Between 7am to 6pm - Pager - [phone]  After 6pm go to [email or web address] - Therapist, nutritional Hospitalists

## 2018-06-16 NOTE — Progress Notes (Signed)
1:1 present. Patient reports anxiety, agitation, and chest tightness to sitter. Patient's vital signs are obtained. Patient has no obvious signs of distress. Patients pulse is elevated, provider Dr. Joseph Art made aware. Patient was provided with 12oz of water, patient had reported not drinking much last night.

## 2018-06-16 NOTE — Progress Notes (Signed)
Patient remains asleep. Respirations within normal limits. Safety maintained on 1:1

## 2018-06-16 NOTE — Progress Notes (Signed)
Patient sleeping, sitter at bedside. No sign of distress.  
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
## 2018-06-16 NOTE — Progress Notes (Signed)
I was informed by primary team about patient swallowing a plastic wrapper today. Primary team informed me that patient did not have any symptoms after it to suggest esophageal obstruction. In fact, he ate more solid food after it without difficulty. And no abdominal pain. I have not seen the patient physically, and obtained history from primary attending. Given that patient eat solid food today, EGD not able to be done today. Since fore ign body swallowed is not a sharp object, and is not causing any esophageal obstruction clinically, no indication for emergent EGD at this time. I've recommended CT abdomen pelvis in the morning, to see if plastic wrapper has advanced past the stomach, and if not, EGD can be done for removal.  I've also recommended that if clinical picture changes, and patient develops any new symptoms, such as abdominal pain, nausea vomiting, trouble s wallowing, or any other reason for concern, to please notify me. Above discussed with primary attending who agrees with the plan.

## 2018-06-16 NOTE — Progress Notes (Signed)
1:1 is present. Patient is in hallway interacting with staff. Patient has no complaints at this time.
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
## 2018-06-16 NOTE — Progress Notes (Signed)
Spoke with and informed patients guardian of the use of restraints.

## 2018-06-16 NOTE — Progress Notes (Signed)
Patient remains asleep in bed. Safety maintained on 1:1. 

## 2018-06-16 NOTE — Progress Notes (Signed)
Patient woke up with a pleasant mood. Communicating appropriately and redirectable. Currently no sign of distress. Was encouraged to express his feelings and thoughts as needed. Safety precautions continue  With 1:1 level of observations.

## 2018-06-16 NOTE — Progress Notes (Signed)
1:1 present. Patient is in room without complaint.

## 2018-06-17 ENCOUNTER — Encounter
Admission: AD | Disposition: A | Discharge status: Other Acute Inpatient Hospital | Payer: Self-pay | Source: Home / Self Care | Attending: Psychiatry

## 2018-06-17 ENCOUNTER — Inpatient Hospital Stay: Payer: Medicaid Other

## 2018-06-17 ENCOUNTER — Inpatient Hospital Stay: Payer: Medicaid Other | Admitting: Anesthesiology

## 2018-06-17 ENCOUNTER — Encounter: Payer: Self-pay | Admitting: Anesthesiology

## 2018-06-17 DIAGNOSIS — T182XXA Foreign body in stomach, initial encounter: Secondary | ICD-10-CM

## 2018-06-17 DIAGNOSIS — T182XXD Foreign body in stomach, subsequent encounter: Secondary | ICD-10-CM

## 2018-06-17 DIAGNOSIS — T189XXD Foreign body of alimentary tract, part unspecified, subsequent encounter: Secondary | ICD-10-CM

## 2018-06-17 DIAGNOSIS — T189XXA Foreign body of alimentary tract, part unspecified, initial encounter: Secondary | ICD-10-CM

## 2018-06-17 HISTORY — PX: ESOPHAGOGASTRODUODENOSCOPY: SHX5428

## 2018-06-17 SURGERY — EGD (ESOPHAGOGASTRODUODENOSCOPY)
Anesthesia: Monitor Anesthesia Care | Laterality: Left

## 2018-06-17 SURGERY — EGD (ESOPHAGOGASTRODUODENOSCOPY)
Anesthesia: General

## 2018-06-17 MED ORDER — PROPOFOL 500 MG/50ML IV EMUL
INTRAVENOUS | Status: DC | PRN
  Administered 2018-06-17: 200 ug/kg/min via INTRAVENOUS

## 2018-06-17 MED ORDER — IOHEXOL 300 MG/ML  SOLN
75.0000 mL | Freq: Once | INTRAMUSCULAR | Status: AC | PRN
  Administered 2018-06-17: 75 mL via INTRAVENOUS

## 2018-06-17 MED ORDER — LORAZEPAM 2 MG/ML IJ SOLN
2.0000 mg | Freq: Three times a day (TID) | INTRAMUSCULAR | Status: DC | PRN
  Administered 2018-06-17 – 2018-06-18 (×2): 2 mg via INTRAMUSCULAR
  Filled 2018-06-17 (×2): qty 1

## 2018-06-17 MED ORDER — SODIUM CHLORIDE 0.9 % IV SOLN
INTRAVENOUS | Status: DC
  Administered 2018-06-17: 11:00:00 via INTRAVENOUS

## 2018-06-17 MED ORDER — LIDOCAINE HCL (CARDIAC) PF 100 MG/5ML IV SOSY
PREFILLED_SYRINGE | INTRAVENOUS | Status: DC | PRN
  Administered 2018-06-17: 40 mg via INTRAVENOUS

## 2018-06-17 MED ORDER — LIDOCAINE HCL (PF) 2 % IJ SOLN
INTRAMUSCULAR | Status: AC
  Filled 2018-06-17: qty 10

## 2018-06-17 MED ORDER — PROPOFOL 10 MG/ML IV BOLUS
INTRAVENOUS | Status: DC | PRN
  Administered 2018-06-17: 60 mg via INTRAVENOUS

## 2018-06-17 MED ORDER — PROPOFOL 10 MG/ML IV BOLUS
INTRAVENOUS | Status: AC
  Filled 2018-06-17: qty 40

## 2018-06-17 MED ORDER — IOPAMIDOL (ISOVUE-300) INJECTION 61%
100.0000 mL | Freq: Once | INTRAVENOUS | Status: AC | PRN
  Administered 2018-06-17: 100 mL via INTRAVENOUS

## 2018-06-17 MED ORDER — CLOZAPINE 25 MG PO TABS
25.0000 mg | ORAL_TABLET | Freq: Every day | ORAL | Status: DC
  Administered 2018-06-17: 25 mg via ORAL
  Filled 2018-06-17: qty 1

## 2018-06-17 NOTE — Progress Notes (Signed)
1:1 present. Patient is pacing halls and interacting with staff. Patient continues to report agitation and anxiety.

## 2018-06-17 NOTE — Progress Notes (Signed)
Patient at this time observed in his room sleeping with 1:1 present with security for safety. Pt. Respirations even and unlabored, no distress noted.

## 2018-06-17 NOTE — Progress Notes (Signed)
Verbal Approval given by on call MD to discontinue old restraint order.

## 2018-06-17 NOTE — Progress Notes (Signed)
1:1 is present. Patient's room has been searched and has been removed of all potential swallowing/self harm risks, including plastic clips from curtains, caps from toilet, small pieces of paper. Per instruction from Dr. Joseph Art patients bed and curtains have been removed and patient is laying on mattress. Patient has no complaints at this time. Patient is cooperative with search and removal process. Will continue to monitor.

## 2018-06-17 NOTE — Progress Notes (Signed)
Remains asleep. Safety maintained with 1:1 level of observation.

## 2018-06-17 NOTE — Progress Notes (Signed)
Patient woke up asking for food and beverages. 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 Got agitated and asked RN to call MD, to inform MD that  Patient is not interested in "that test" anymore, that he wants to eat. MD on call was contacted and recommended that patient needs to remain NPO as ordered. Ativan 2 mg IM, PRN ordered. Patient was given MD information. Was not happy about it but accepted to remain cooperative. No need for IM Aitivan at this moment. Went back to bed but agitated, cursing. Safety precautions reinforced. RN will continue to monitor. Patient remains on 1:1 for safety.

## 2018-06-17 NOTE — Progress Notes (Signed)
1:1 present. Patient has reported that he has placed an object into his rectum.

## 2018-06-17 NOTE — Progress Notes (Addendum)
Patient stayed awake for a while and had just fallen asleep. Respirations within defined limits. Patient able to move his extremities. No sign of distress. Safety maintained  Per 1:1 level of observation.

## 2018-06-17 NOTE — Progress Notes (Signed)
1:1 is present. Patient continues to be agitated and making verbal threats to staff.

## 2018-06-17 NOTE — Progress Notes (Signed)
Patient at this time is escorted off the unit with this RN, BHT, and 1:1 police escort for safety and security. Patient is being taken for CT examination per MD orders.

## 2018-06-17 NOTE — Progress Notes (Signed)
1:1 present. Patient is cooperative with PRN medications. Patient still reports increasing agitation and states "I am going to show out, just wait." Patient make threats towards staff members.

## 2018-06-17 NOTE — Op Note (Signed)
[facility] Gastroenterology Patient Name: Christian Alexander Procedure Date: 06/17/2018 11:07 AM MRN: [id number] Account #: [id number] Date of Birth: [birth year] Admit Type: Inpatient Age: 17 Room: [facility] ENDO ROOM 4 Gender: Male Note Status: Finalized Procedure:            Upper GI endoscopy Indications:          Foreign body in the GI tract Providers:            Coren Crownover B. Maximino Greenland MD, MD Referring MD:         Audery Amel (Referring MD), Duke Salvia. Pavelock                        (Referring MD) Medicines:            Monitored Anesthesia Care Complications:        No immediate complications. Procedure:            Pre-Anesthesia Assessment:                       - The risks and benefits of the procedure and the                        sedation options and risks were discussed with the                        patient. All questions were answered and informed                        consent was obtained.                       - Patient identification and proposed procedure were                        verified prior to the procedure.                       - ASA Grade Assessment: II - A patient with mild                        systemic disease.                       After obtaining informed consent, the endoscope was                        passed under direct vision. Throughout the procedure,                        the patient's blood pressure, pulse, and oxygen                        saturations were monitored continuously. The Endoscope                        was introduced through the mouth, and advanced to the                        second part of duodenum. The upper GI endoscopy was  accomplished with ease. The patient tolerated the                        procedure well. Findings:      The examined esophagus was normal.      A large soft green plastic bag, and a white bent hard plastic piece were       found in the gastric body. Removal was  accomplished with a Roth net and       snare.      The exam of the stomach was otherwise normal.      The examined duodenum was normal. Impression:           - Normal esophagus.                       - A large soft green plastic bag, and a white bent hard                        plastic piece were found in the stomach. Removal was                        successful.                       - Normal examined duodenum.                       - No other foreign bodies present until the extent of                        the exam. Recommendation:       - Return patient to hospital ward for ongoing care.                       - Avoid further foreign body ingestions. Continue 1:1                        observation                       - The findings and recommendations were discussed with                        the patient.                       - Resume previous diet.                       - Return to primary care physician in 4 weeks. Procedure Code(s):    --- Professional ---                       [phone], Esophagogastroduodenoscopy, flexible, transoral;                        with removal of foreign body(s) Diagnosis Code(s):    --- Professional ---                       U04.5WUJ, Foreign body in stomach, initial encounter  T18.9XXA, Foreign body of alimentary tract, part                        unspecified, initial encounter CPT copyright 2018 American Medical Association. All rights reserved. The codes documented in this report are preliminary and upon coder review may  be revised to meet current compliance requirements.  Melodie Bouillon, MD Michel Bickers B. Maximino Greenland MD, MD 06/17/2018 11:46:24 AM This report has been signed electronically. Number of Addenda: 0 Note Initiated On: 06/17/2018 11:07 AM      [facility]

## 2018-06-17 NOTE — OR Nursing (Signed)
2 foreign bodies removed from stomach.

## 2018-06-17 NOTE — Progress Notes (Signed)
In room awake. Safety monitored on 1:1. No sign of distress

## 2018-06-17 NOTE — Transfer of Care (Signed)
Immediate Anesthesia Transfer of Care Note  Patient: Christian Alexander  Procedure(s) Performed: ESOPHAGOGASTRODUODENOSCOPY (EGD) (N/A )  Patient Location: PACU  Anesthesia Type:General  Level of Consciousness: awake, alert  and oriented  Airway & Oxygen Therapy: Patient Spontanous Breathing and Patient connected to nasal cannula oxygen  Post-op Assessment: Report given to RN and Post -op Vital signs reviewed and stable  Post vital signs: Reviewed and stable  Last Vitals:  Vitals Value Taken Time  BP    Temp    Pulse    Resp    SpO2      Last Pain:  Vitals:   06/17/18 0755  TempSrc:   PainSc: 0-No pain         Complications: No apparent anesthesia complications

## 2018-06-17 NOTE — Anesthesia Post-op Follow-up Note (Signed)
Anesthesia QCDR form completed.        

## 2018-06-17 NOTE — Progress Notes (Signed)
Patient in the day room with playing cards with staff. Pleasant and states "make sure you don't send me that male sitter, I do well with females". Threatening his previous male sitter and saying that "I am gonna make him lose his job, I swear".  Safety monitored on 1:1.

## 2018-06-17 NOTE — Progress Notes (Signed)
[facility] MD Progress Note  06/17/2018 12:29 PM Christian Alexander  MRN:  [id number]  Subjective:    Christian Alexander is a 17 year old incompetent adult with developmental disability, bipolar disorder and behavioral problems resulting in self injurious and aggressive behaviors, ordered here from local jail after he repeatedly attempted to kill himself. Unclear why he was not ordered straight to [facility]. He has been awaiting bed there for ongoing agitation, self injurious behaviors and aggression towards staff. With multiple hospitalizations, he has been tried on all known medications, with the exception of Clozapine. Stared slow titration (young, male, with h/o hear disease) of Clozapine. Antipsychotics have been mostly ineffective. Many cause urinary retention. "Likes" low dose Trileptal but mostly Geodon IM.   Pt demanding to transfer to medical floor , continues to threat staff verbally and continues to make threat to swallow if he gets something.  Pt had upper GI endoscopy  this am- A large soft green plastic bag, and a white bent hard plastic piece were found in the stomach, removal was successful. Pt continue to be agitated , pt needed physical hold and chair restraints yesterday eve, pt hit staff, pt agitated this am wanting to eat while NPO for EGD, received multiple prn im including ativan 2mg  , and geodon 20 mg, All  Potential swallowing /self harm risk objects removed from pt' s room.  I called pt's guardian Christian Alexander  and updated the information,  She  said that she also has talked to the anesthesiologist and the nurse and has given the consent for the procedure.  Principal Problem: Bipolar I disorder, most recent episode depressed, severe without psychotic features (HCC) Diagnosis:   Patient Active Problem List   Diagnosis Date Noted  . Gastric foreign body [T18.2XXA]   . Foreign body in alimentary tract [T18.9XXA]   . 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  Bipolar I disorder, most recent episode depressed, severe without psychotic features (HCC) [F31.4] 06/08/2018  . 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 Bipolar 1 disorder, depressed, moderate (HCC) [F31.32] 08/15/2014  . 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 ODD (oppositional defiant disorder) [F91.3] 08/15/2014  . 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 Homicidal ideation [R45.850]   . Suicidal ideation [R45.851]    Total Time spent with patient: 25 min  Past Psychiatric History: bipolar disorder  Past Medical History:  Past Medical History:  Diagnosis Date  . Aortic valve stenosis   . Attention deficit disorder (ADD)   . ODD (oppositional defiant disorder)     Past Surgical History:  Procedure Laterality Date  . CARDIAC SURGERY     Family History: History reviewed. No pertinent family history. Family Psychiatric  History: father with mental illness Social History:  Social History   Substance and Sexual Activity  Alcohol Use No     Social History   Substance and Sexual Activity  Drug Use Yes  . Types: Marijuana    Social History   Socioeconomic History  . Marital status: Single    Spouse name: Not on file  . Number of children: Not on file  . Years of education: Not on file  . Highest education level: Not on file  Occupational History  . Not on file  Social Needs  . Financial resource strain: Not on file  . Food insecurity:    Worry: Not on file    Inability: Not on file  . Transportation needs:  Medical: Not on file    Non-medical: Not on file  Tobacco Use  . Smoking status: Current Some Day Smoker    Packs/day: 1.00    Types: Cigarettes  . Smokeless tobacco: Never Used  Substance and Sexual Activity  . Alcohol use: No  . Drug use: Yes    Types: Marijuana  . Sexual activity: Never  Lifestyle  . Physical activity:    Days per week: Not on file    Minutes per session:  Not on file  . Stress: Not on file  Relationships  . Social connections:    Talks on phone: Not on file    Gets together: Not on file    Attends religious service: Not on file    Active member of club or organization: Not on file    Attends meetings of clubs or organizations: Not on file    Relationship status: Not on file  Other Topics Concern  . Not on file  Social History Narrative  . Not on file   Additional Social History:                         Sleep: Poor  Appetite:  Fair  Current Medications: Current Facility-Administered Medications  Medication Dose Route Frequency Provider Last Rate Last Dose  . 0.9 %  sodium chloride infusion   Intravenous Continuous Melodie Bouillon B, MD 20 mL/hr at 06/17/18 1120    . acetaminophen (TYLENOL) tablet 650 mg  650 mg Oral Q6H PRN Clapacs, Jackquline Denmark, MD   650 mg at 06/10/18 2256  . ALPRAZolam (XANAX) tablet 1 mg  1 mg Oral BID Kristine Linea B, MD   Stopped at 06/17/18 0759  . alum & mag hydroxide-simeth (MAALOX/MYLANTA) 200-200-20 MG/5ML suspension 30 mL  30 mL Oral Q4H PRN Clapacs, John T, MD      . chlorproMAZINE (THORAZINE) injection 50 mg  50 mg Intramuscular TID PRN Pucilowska, Jolanta B, MD      . LORazepam (ATIVAN) injection 2 mg  2 mg Intramuscular Q8H PRN Beverly Sessions, MD   2 mg at 06/17/18 0755  . LORazepam (ATIVAN) tablet 2 mg  2 mg Oral Q6H PRN Pucilowska, Jolanta B, MD   2 mg at 06/16/18 0741  . magnesium hydroxide (MILK OF MAGNESIA) suspension 30 mL  30 mL Oral Daily PRN Clapacs, Jackquline Denmark, MD   30 mL at 06/16/18 2045  . nicotine polacrilex (NICORETTE) gum 2 mg  2 mg Oral PRN Pucilowska, Jolanta B, MD   2 mg at 06/15/18 1441  . NON FORMULARY 1 Inhaler  1 Inhaler Oral Daily PRN Pucilowska, Jolanta B, MD      . Oxcarbazepine (TRILEPTAL) tablet 300 mg  300 mg Oral BID Pucilowska, Jolanta B, MD   Stopped at 06/17/18 0759  . QUEtiapine (SEROQUEL) tablet 50 mg  50 mg Oral QHS Pucilowska, Jolanta B, MD   50 mg at  06/16/18 2045  . tamsulosin (FLOMAX) capsule 0.4 mg  0.4 mg Oral QPC breakfast Pucilowska, Jolanta B, MD   Stopped at 06/17/18 0800  . temazepam (RESTORIL) capsule 30 mg  30 mg Oral QHS PRN Pucilowska, Jolanta B, MD   30 mg at 06/16/18 2045  . ziprasidone (GEODON) injection 20 mg  20 mg Intramuscular BID PRN Pucilowska, Jolanta B, MD   20 mg at 06/17/18 0935    Lab Results:  Results for orders placed or performed during the hospital encounter of 06/08/18 (from the past 48  hour(s))  Glucose, capillary     Status: None   Collection Time: 06/15/18  3:41 PM  Result Value Ref Range   Glucose-Capillary 92 70 - 99 mg/dL  Troponin I     Status: None   Collection Time: 06/15/18  5:31 PM  Result Value Ref Range   Troponin I <0.03 <0.03 ng/mL    Comment: Performed at [facility], [address] [place], [place] [address]    Blood Alcohol level:  Lab Results  Component Value Date   [facility] <10 06/06/2018   ETH <5 12/13/2015    Metabolic Disorder Labs: Lab Results  Component Value Date   HGBA1C 4.5 (L) 06/09/2018   MPG 82.45 06/09/2018   MPG 100 08/16/2014   No results found for: PROLACTIN Lab Results  Component Value Date   CHOL 140 06/09/2018   TRIG 50 06/09/2018   HDL 59 06/09/2018   CHOLHDL 2.4 06/09/2018   VLDL 10 06/09/2018   LDLCALC 71 06/09/2018   LDLCALC 57 08/16/2014    Physical Findings: AIMS:  , ,  ,  ,    CIWA:    COWS:     Musculoskeletal: Strength & Muscle Tone: within normal limits Gait & Station: normal Patient leans: N/A  Psychiatric Specialty Exam: Physical Exam  Nursing note and vitals reviewed. Psychiatric: His affect is labile and inappropriate. His speech is rapid and/or pressured. He is agitated, hyperactive and combative. Thought content is delusional. Cognition and memory are impaired. He expresses impulsivity. He expresses suicidal ideation.    Review of Systems  Neurological: Negative.   Psychiatric/Behavioral: Negative.   All  other systems reviewed and are negative.   Blood pressure (!) 98/50, pulse 84, temperature 97.6 F (36.4 C), temperature source Tympanic, resp. rate 18, height 5' 2.01" (1.575 m), weight 49 kg, SpO2 100 %.Body mass index is 19.75 kg/m.  General Appearance: Casual  Eye Contact:  Good  Speech:  Pressured  Volume:  Increased  Mood:  Angry, Dysphoric and Irritable  Affect:  Congruent  Thought Process:  Disorganized  Orientation:  Full (Time, Place, and Person)  Thought Content:  Illogical, Delusions, Obsessions and Paranoid Ideation  Suicidal Thoughts:  Yes.  with intent/plan  Homicidal Thoughts:  Yes.  with intent/plan  Memory:  Immediate;   Poor Recent;   Poor Remote;   Poor  Judgement:  Poor  Insight:  Lacking  Psychomotor Activity:  Increased  Concentration:  Concentration: Poor and Attention Span: Poor  Recall:  Poor  Fund of Knowledge:  Poor  Language:  Poor  Akathisia:  No  Handed:  Right  AIMS (if indicated):     Assets:  Communication Skills Desire for Improvement Financial Resources/Insurance Housing Physical Health Resilience  ADL's:  Intact  Cognition:  WNL  Sleep:  Number of Hours: 5.45     Treatment Plan Summary: Daily contact with patient to assess and evaluate symptoms and progress in treatment and Medication management   Mr. Cossin is a 17 year old male with a history of mood instability and self injurious behavior admitted for repeated suicide attempts while in jail.  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 Pt has  the guardian .   Pt continue to agitate easily,  threatening staff, swallowing inedible objects, All  potential swallowing /self harm risk objects removed from pt' s room.  I called pt's guardian Christian Alexander  and updated the information, #Agitation, not improved -1:1 sitter plus security guard  -oral Haldol, Ativan available -  Geodon 20 mg IM BID PRN -Thorazine  IM PRN   #Mood/psychosis, not  improving -continueSeroquel 50 mg nightly -continueXanax 1 mg BID Continue Trileptal to 300 mg BID -clozapine #Insomnia, slept 5 hours only in spite of treatment -Restoril 30 mg PRN insomnia  #Status post Ross procedure -Cardiac ECHO completed on 06/11/2018   #Urethra foreign body insertion -cystoscopy did not reveal foreign body -urology input is greatly appreciated -self cath for urinary retention -no indication for Flomax but patient claims it helps  #Smoking cessation -nicorette gum PRN  #Labs -lipid panel, TSH, A1C are normal  #Social -incompetent adult -[place]  #Disposition -awaiting bed [facility], made priority list,  -he will return to his group home if he improves   Beverly Sessions, MD 06/17/2018, 12:29 PMPatient ID: Christian Alexander, male   DOB: 2001-05-11, 17 y.o.   MRN: [id number] Patient ID: Christian Alexander, male   DOB: [birth year], 19 y.o.   MRN: [id number]

## 2018-06-17 NOTE — Progress Notes (Signed)
Patient has returned from endo without complaint. Two foreign bodies were removed from patients stomach. Will continue to monitor. 1:1 is present

## 2018-06-17 NOTE — Progress Notes (Signed)
Patient at this time with this writer, 1:1 BHT, and 1:1 security guard. Pt. Is calmer at this time, eating snack with direct supervision of eating process. Will continue to monitor for safety.

## 2018-06-17 NOTE — Progress Notes (Signed)
Patient at this time observed visibly anxious and reports to this writer anxiety. Patient notified he will be going to radiology for per MD order testings. Pt. Agrees/verbalizes to be complaint with MD orders for radiology testings. Patient given PRN medications for anxiety per patient request. Will continue to monitor for safety. Pt. Vital signs taken for additional precautions. Vital signs WDL.

## 2018-06-17 NOTE — Progress Notes (Signed)
1:1 is present. Patient is transported to endoscopy for procedure. Patient tolerates transportation well and is cooperative.

## 2018-06-17 NOTE — Progress Notes (Signed)
Patient at this time escorted successfully back to the unit with this RN, BHT, 1:1 Emergency planning/management officer. Patient upon arrival to locked hallway and his room requested night time sleep aid that was given per Patient request. Will continue to monitor for safety.

## 2018-06-17 NOTE — Progress Notes (Signed)
Nurse reported me  that pt stated that he inserted something thin and long material that he found in the bathroom, into his anus/rectum, this evening. Per nursing staff, pt asymptomatic. Xray pelvis- negative for  radiopaque object. I called GI oncall,  Dr. Maximino Greenland, who suggested to call surgery. I called on call surgery Dr. Maia Plan who recommended to do CT pelvis and abdomen, and he states that he will follow up if something comes up in CT. CT abd/pelvis ordered.

## 2018-06-17 NOTE — Progress Notes (Signed)
Dr. Joseph Art made aware of patient report. Orders obtained for x-ray to verify.

## 2018-06-17 NOTE — Progress Notes (Signed)
Dr. Joseph Art informed me that pt has inserted rectal objects. I have recommended calling surgery as per management guidelines (see uptodate), transanal approach by surgery is recommended. No indication for colonoscopy at this time.

## 2018-06-17 NOTE — Plan of Care (Signed)
  Problem: Education: Goal: Ability to verbalize precipitating factors for violent behavior will improve Outcome: Not Progressing   Problem: Health Behavior/Discharge Planning: Goal: Ability to implement measures to prevent violent behavior in the future will improve Outcome: Not Progressing   Problem: Safety: Goal: Ability to demonstrate self-control will improve Outcome: Not Progressing Goal: Ability to redirect hostility and anger into socially appropriate behaviors will improve Outcome: Not Progressing

## 2018-06-17 NOTE — Consult Note (Signed)
Melodie Bouillon, MD [address], [address], [place], [place], [address] [address], [place], [place], [address] Phone: [phone]  Fax: [phone]  Consultation  Referring Provider:     Audery Amel, MD Primary Care Physician:  Gilda Crease, MD Reason for Consultation:     Foreign Body Ingestion  Date of Admission:  06/08/2018 Date of Consultation:  06/17/2018         HPI:   Christian Alexander is a 17 y.o. male admitted to the Psych unit who swallowed a plastic wrapper with his cookie yesterday and ate solid food dinner after that without difficulty. The patient denies abdominal or flank pain, anorexia, nausea or vomiting, dysphagia, change in bowel habits or black or bloody stools or weight loss.  No shortness or breath. Tolerating food and liquids well.  Past Medical History:  Diagnosis Date  . Aortic valve stenosis   . Attention deficit disorder (ADD)   . ODD (oppositional defiant disorder)     Past Surgical History:  Procedure Laterality Date  . CARDIAC SURGERY      Prior to Admission medications   Medication Sig Start Date End Date Taking? Authorizing Provider  naltrexone (DEPADE) 50 MG tablet Take 50 mg by mouth daily. 04/30/18   [provider]  Oxcarbazepine (TRILEPTAL) 300 MG tablet Take 300-900 mg by mouth See admin instructions. Take 1 tablet (300MG ) by mouth every morning and lunchtime and take 3 tablets (900MG ) by mouth every night at bedtime 04/30/18   [provider]  QUEtiapine (SEROQUEL) 100 MG tablet Take 50-200 mg by mouth See admin instructions. Take  tablet (50MG ) by mouth every morning and 2 tablets (200MG ) by mouth every night at bedtime 04/30/18   [provider]  tamsulosin (FLOMAX) 0.4 MG CAPS capsule Take 0.4 mg by mouth.    [provider]    History reviewed. No pertinent family history.   Social History   Tobacco Use  . Smoking status: Current Some Day Smoker    Packs/day: 1.00   Types: Cigarettes  . Smokeless tobacco: Never Used  Substance Use Topics  . Alcohol use: No  . Drug use: Yes    Types: Marijuana    Allergies as of 06/07/2018  . (No Known Allergies)    Review of Systems:    All systems reviewed and negative except where noted in HPI.   Physical Exam:  Vital signs in last 24 hours: Vitals:   06/16/18 2023 06/17/18 0449 06/17/18 0553 06/17/18 0554  BP: 109/80 111/84 108/74 (!) 110/59  Pulse: 96 (!) 112 (!) 106 (!) 117  Resp:   16   Temp:   97.8 F (36.6 C)   TempSrc:   Oral   SpO2: 99% 98% 98% 98%  Weight:      Height:       Last BM Date: 06/14/18 General:   Pleasant, cooperative in NAD Head:  Normocephalic and atraumatic. Eyes:   No icterus.   Conjunctiva pink. PERRLA. Ears:  Normal auditory acuity. Neck:  Supple; no masses or thyroidomegaly Lungs: Respirations even and unlabored. Lungs clear to auscultation bilaterally.   No wheezes, crackles, or rhonchi.  Abdomen:  Soft, nondistended, nontender. Normal bowel sounds. No appreciable masses or hepatomegaly.  No rebound or guarding.  Neurologic:  Alert and oriented x3;  grossly normal neurologically. Skin:  Intact without significant lesions or rashes. Cervical Nodes:  No significant cervical adenopathy. Psych:  Alert and cooperative. Normal affect.  LAB RESULTS: No  results for input(s): WBC, HGB, HCT, PLT in the last 72 hours. BMET No results for input(s): NA, K, CL, CO2, GLUCOSE, BUN, CREATININE, CALCIUM in the last 72 hours. LFT No results for input(s): PROT, ALBUMIN, AST, ALT, ALKPHOS, BILITOT, BILIDIR, IBILI in the last 72 hours. PT/INR No results for input(s): LABPROT, INR in the last 72 hours.  STUDIES: Ct Head Wo Contrast  Result Date: 06/15/2018 CLINICAL DATA:  17 year old male with a history of unresponsiveness EXAM: CT HEAD WITHOUT CONTRAST TECHNIQUE: Contiguous axial images were obtained from the base of the skull through the vertex without intravenous contrast.  COMPARISON:  None. FINDINGS: Brain: No acute intracranial hemorrhage. No midline shift or mass effect. Gray-white differentiation maintained. Unremarkable appearance of the ventricular system. Vascular: Unremarkable. Skull: No acute fracture.  No aggressive bone lesion identified. Sinuses/Orbits: Unremarkable appearance of the orbits. Mastoid air cells clear. No middle ear effusion. No significant sinus disease. Other: None IMPRESSION: Negative head CT Electronically Signed   By: Gilmer Mor D.O.   On: 06/15/2018 17:01   Ct Abdomen Pelvis W Contrast  Result Date: 06/17/2018 CLINICAL DATA:  Foreign body ingestion. Patient ingested plastic wrap of cookies yesterday. EXAM: CT ABDOMEN AND PELVIS WITH CONTRAST TECHNIQUE: Multidetector CT imaging of the abdomen and pelvis was performed using the standard protocol following bolus administration of intravenous contrast. CONTRAST:  ISOVUE-300 IOPAMIDOL (ISOVUE-300) INJECTION 61% COMPARISON:  None. FINDINGS: Lower chest: The lung bases are clear without focal nodule, mass, or airspace disease. The heart size is normal. No significant pleural or pericardial effusion is present. Hepatobiliary: No focal liver abnormality is seen. No gallstones, gallbladder wall thickening, or biliary dilatation. Pancreas: Unremarkable. No pancreatic ductal dilatation or surrounding inflammatory changes. Spleen: Normal in size without focal abnormality. Adrenals/Urinary Tract: Adrenal glands are normal bilaterally. Kidneys and ureters are within normal limits. There is no stone or mass lesion. No obstruction is present. The urinary bladder is within normal limits. Stomach/Bowel: Stomach duodenum are within limits. No foreign body is identified. Small bowel is unremarkable. There is no obstruction. Terminal ileum is within normal limits. The appendix is visualized and normal. The ascending and transverse colon are within normal limits. The descending and sigmoid colon are normal.  Vascular/Lymphatic: No significant vascular findings are present. No enlarged abdominal or pelvic lymph nodes. Reproductive: Prostate is unremarkable. Other: A small amount of free fluid layers in the anatomic pelvis. Free fluid or free air is present otherwise. There is no ventral hernia. Musculoskeletal: Levoconvex curvature of the lumbar spine is centered at L2-3. Vertebral body heights alignment are otherwise maintained. Pelvis is within normal limits. Hips are located and normal bilaterally. IMPRESSION: 1. No radiopaque foreign body evident. 2. No obstruction free air. 3. Small a free fluid within the anatomic pelvis is nonspecific. This could be related to nonspecific enteritis. No discrete etiology is evident. 4. Lumbar scoliosis. Electronically Signed   By: Marin Roberts M.D.   On: 06/17/2018 09:29   Dg Chest Port 1 View  Result Date: 06/15/2018 CLINICAL DATA:  Chest pain.  Decreased responsiveness today. EXAM: PORTABLE CHEST 1 VIEW COMPARISON:  PA and lateral chest 08/08/2014. FINDINGS: Lungs clear. Heart size normal. No pneumothorax or pleural effusion. Thoracolumbar scoliosis noted. IMPRESSION: No acute disease. Electronically Signed   By: Drusilla Kanner M.D.   On: 06/15/2018 19:26      Impression / Plan:   Christian Alexander is a 17 y.o. y/o male with foreign body ingestion  Pt swallowed a plastic wrapper  CT does  not show the object as it is not radioopaque EGD indicated to ensure it has passed the pylorus If still present it can be removed during the EGD Keep NPO Consent obtained from Legal Guardian Christian Alexander  Avoid any further foreign body ingestions Continue 1:1 observation  I have discussed alternative options, risks & benefits,  which include, but are not limited to, bleeding, infection, perforation,respiratory complication & drug reaction.  The legal guardian agrees with this plan & written consent will be obtained.     Thank you for involving me in the care of  this patient.      LOS: 9 days   Pasty Spillers, MD  06/17/2018, 10:26 AM

## 2018-06-17 NOTE — Progress Notes (Addendum)
Patient woke up and started complaining again, still claiming that he does not need this test anymore "I am hungry, I have to eat now". Was able to calm down a little bit  per encouragements.  Used the bathroom and had a moderate bowel movement. Currently in room with sitter.

## 2018-06-17 NOTE — Anesthesia Preprocedure Evaluation (Addendum)
Anesthesia Evaluation  Patient identified by MRN, date of birth, ID band Patient awake    Reviewed: Allergy & Precautions, H&P , NPO status , Patient's Chart, lab work & pertinent test results  Airway Mallampati: II  TM Distance: >3 FB Neck ROM: full    Dental  (+) Poor Dentition   Pulmonary neg pulmonary ROS, Current Smoker,    breath sounds clear to auscultation       Cardiovascular + Valvular Problems/Murmurs  Rhythm:regular Rate:Tachycardia  Congenital heart disease s/p Ross procedure 2016.   Echo 06/11/18: normal     Neuro/Psych PSYCHIATRIC DISORDERS Bipolar Disorder negative neurological ROS     GI/Hepatic negative GI ROS, Neg liver ROS,   Endo/Other  negative endocrine ROS  Renal/GU negative Renal ROS  negative genitourinary   Musculoskeletal   Abdominal   Peds  Hematology negative hematology ROS (+)   Anesthesia Other Findings Past Medical History: No date: Aortic valve stenosis No date: Attention deficit disorder (ADD) No date: ODD (oppositional defiant disorder)  Past Surgical History: No date: CARDIAC SURGERY  BMI    Body Mass Index:  19.75 kg/m      Reproductive/Obstetrics negative OB ROS                           Anesthesia Physical Anesthesia Plan  ASA: II  Anesthesia Plan: General   Post-op Pain Management:    Induction:   PONV Risk Score and Plan: Propofol infusion and TIVA  Airway Management Planned:   Additional Equipment:   Intra-op Plan:   Post-operative Plan:   Informed Consent: I have reviewed the patients History and Physical, chart, labs and discussed the procedure including the risks, benefits and alternatives for the proposed anesthesia with the patient or authorized representative who has indicated his/her understanding and acceptance.   Dental Advisory Given  Plan Discussed with: Anesthesiologist, CRNA and Surgeon  Anesthesia Plan  Comments:         Anesthesia Quick Evaluation

## 2018-06-17 NOTE — Progress Notes (Signed)
1:1 present. Patient is cooperative and content at this time. Patient is provided a snack and meal tray is ordered.

## 2018-06-17 NOTE — CIRT (Addendum)
Patient was intent on self harm, patient began to rub sternal area violently. Security guard on 1:1 with patient attempted to stop patient from self harm. Patient was then escorted to room at approx 1757 using manual hold. Patient became nonviolent and compliant and was released from escort manual hold at approx 1758.

## 2018-06-17 NOTE — Progress Notes (Signed)
Patient at this time given additional education on radiology examination ordered by the provider. Patient at this time states, "I'm not having any kind of IV put in, I'll beat the fuck out of anyone that tries to put an IV in me!". On Call MD notified of statements made by patient as well as information given by radiology. This Clinical research associate was informed by radiology that they are extremely "backed up" and will get to the ordered examination when they're able to. Will continue to monitor for safety and security.

## 2018-06-17 NOTE — Progress Notes (Signed)
1:1 present. Patient is currently at endo for procedure.

## 2018-06-17 NOTE — [facility] Group Notes (Signed)
LCSW Group Therapy Note 06/17/2018 1:15pm  Type of Therapy and Topic: Group Therapy: Feelings Around Returning Home & Establishing a Supportive Framework and Supporting Oneself When Supports Not Available  Participation Level: Did Not Attend  Description of Group:  Patients first processed thoughts and feelings about upcoming discharge. These included fears of upcoming changes, lack of change, new living environments, judgements and expectations from others and overall stigma of mental health issues. The group then discussed the definition of a supportive framework, what that looks and feels like, and how do to discern it from an unhealthy non-supportive network. The group identified different types of supports as well as what to do when your family/friends are less than helpful or unavailable  Therapeutic Goals  1. Patient will identify one healthy supportive network that they can use at discharge. 2. Patient will identify one factor of a supportive framework and how to tell it from an unhealthy network. 3. Patient able to identify one coping skill to use when they do not have positive supports from others. 4. Patient will demonstrate ability to communicate their needs through discussion and/or role plays.  Summary of Patient Progress:  Pt was invited to attend group but chose not to attend. CSW will continue to encourage pt to attend group throughout their admission.   Therapeutic Modalities Cognitive Behavioral Therapy Motivational Interviewing   Veniamin Kincaid  CUEBAS-COLON, LCSW 06/17/2018 12:00 PM  

## 2018-06-17 NOTE — Progress Notes (Signed)
Security search and removal process completed of patient's room and bathroom area. All potential harmful items removed from the patient's room per MD orders. Will continue to monitor for safety and security per orders.

## 2018-06-17 NOTE — Progress Notes (Signed)
Remains asleep, sitter at bedside. No sign of distress.

## 2018-06-17 NOTE — Anesthesia Postprocedure Evaluation (Signed)
Anesthesia Post Note  Patient: Christian Alexander  Procedure(s) Performed: ESOPHAGOGASTRODUODENOSCOPY (EGD) (N/A )  Patient location during evaluation: PACU Anesthesia Type: General Level of consciousness: awake and alert Pain management: pain level controlled Vital Signs Assessment: post-procedure vital signs reviewed and stable Respiratory status: spontaneous breathing, nonlabored ventilation, respiratory function stable and patient connected to nasal cannula oxygen Cardiovascular status: blood pressure returned to baseline and stable Postop Assessment: no apparent nausea or vomiting Anesthetic complications: no     Last Vitals:  Vitals:   06/17/18 1152 06/17/18 1202  BP: (!) 98/52 (!) 98/50  Pulse: 85 84  Resp: (!) 24 18  Temp:    SpO2: 100% 100%    Last Pain:  Vitals:   06/17/18 1202  TempSrc:   PainSc: 0-No pain                 Jovita Gamma

## 2018-06-17 NOTE — Progress Notes (Signed)
1:1 Present. Patient is laying in room on mattress. Patient has no complaints of pain. Patient refuses to have vital signs obtained at this time.

## 2018-06-17 NOTE — Progress Notes (Signed)
1:1 present. Patient is transported to CT scan. Patient is cooperative with transport and procedure.

## 2018-06-17 NOTE — Progress Notes (Signed)
Patient has been in bed sleeping. 1:1level of  observation maintained.

## 2018-06-17 NOTE — Progress Notes (Signed)
1:1 present. Patient's agitation has increased and patient reports increasing anxiety. Patient is in room laying on mattress. Patient has no complaints of pain at this time.

## 2018-06-17 NOTE — Progress Notes (Signed)
Patient is increasingly agitated. Patient is yelling at staff members, threatening staff. Patient is posturing to staff. Patient is swing arms to make staff "flinch." Patient is cooperative with IM PRN medication. Patient's room is searched and multiple objects removed. Patient continues to threatening "If I find something I will swallow it." Dr. Joseph Art is made aware.

## 2018-06-18 ENCOUNTER — Encounter: Payer: Self-pay | Admitting: Gastroenterology

## 2018-06-18 DIAGNOSIS — T189XXS Foreign body of alimentary tract, part unspecified, sequela: Secondary | ICD-10-CM

## 2018-06-18 MED ORDER — POLYETHYLENE GLYCOL 3350 17 G PO PACK
17.0000 g | PACK | Freq: Every day | ORAL | Status: DC
  Administered 2018-06-18 – 2018-06-21 (×3): 17 g via ORAL
  Filled 2018-06-18 (×4): qty 1

## 2018-06-18 MED ORDER — CLOZAPINE 25 MG PO TABS
50.0000 mg | ORAL_TABLET | Freq: Every day | ORAL | Status: DC
  Administered 2018-06-18 – 2018-06-19 (×2): 50 mg via ORAL
  Filled 2018-06-18 (×2): qty 2

## 2018-06-18 NOTE — Plan of Care (Signed)
D: Pt denies SI/HI/AV hallucinations. Pt is pleasant and cooperative at this time. Patient started the morning being anxious, irritable and impulsive. He did receive PRN ativan 2 mg this morning and had good results noted. Patient has been redirectable and cooperative with his sitter. Monitoring continues. A: Pt was offered support and encouragement. Pt was given scheduled medications. Q 15 minute checks were done for safety.  R: Pt is taking medication. Pt has no complaints.Pt receptive to treatment and safety maintained on unit.    Problem: Education: Goal: Verbalization of understanding the information provided will improve Outcome: Progressing   Problem: Coping: Goal: Ability to interact with others will improve Outcome: Progressing   Problem: Coping: Goal: Level of anxiety will decrease Outcome: Progressing   Problem: Safety: Goal: Ability to remain free from injury will improve Outcome: Progressing   Problem: Safety: Goal: Ability to demonstrate self-control will improve Outcome: Progressing

## 2018-06-18 NOTE — Progress Notes (Signed)
Nursing 1:1 note D:Pt observed sitting in hallway in recliner with staff. Patient is calm and cooperative at this time. Patient is talking with staff and behavior is appropriate. A: 1:1 observation continues for safety  R: pt remains safe

## 2018-06-18 NOTE — Progress Notes (Addendum)
[facility] MD Progress Note  06/18/2018 5:02 PM [name]  MRN:  [id number]  Subjective:    [name] had a good day today. No acute events. He saw Dr. Tobi Bastos gastroenterology for foreign body ingestion follow up. Xray scheduled for tomorrow. The patient has been constipated. He has no other complaints but insists on "going medical or to [facility]". He was explained again that there is no indication to transfer to medical floor. He has been referred to [facility] and is on wait list there but no beds are available so far. He accepts medications and tolerates them well.  The patient has been observed very closely as he keep threatening to hurt himself with the first opportunity. ALL items were removed from his room except for bare mattress. He threatens to tear it apart.  He knows that if he can hold his cool for three days, we will call his guardian to discuss discharge to his group home.  Spoke with Dr. Tobi Bastos, gastroenterology. Will give Miralax tonight.  Spoke with Dr. Jodi Geralds. No need for surgical intervention.  Principal Problem: Bipolar I disorder, most recent episode depressed, severe without psychotic features (HCC) Diagnosis:   Patient Active Problem List   Diagnosis Date Noted  . Bipolar I disorder, most recent episode depressed, severe without psychotic features (HCC) [F31.4] 06/08/2018    Priority: High  . Gastric foreign body [T18.2XXA]   . Foreign body in alimentary tract [T18.9XXA]   . 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 Bipolar 1 disorder, depressed, moderate (HCC) [F31.32] 08/15/2014  . 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 ODD (oppositional defiant disorder) [F91.3] 08/15/2014  . 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 Homicidal ideation [R45.850]   . Suicidal ideation [R45.851]    Total Time spent with  patient: 20 minutes  Past Psychiatric History: bipolar disorder  Past Medical History:  Past Medical History:  Diagnosis Date  . Aortic valve stenosis   . Attention deficit disorder (ADD)   . ODD (oppositional defiant disorder)     Past Surgical History:  Procedure Laterality Date  . CARDIAC SURGERY    . ESOPHAGOGASTRODUODENOSCOPY N/A 06/17/2018   Procedure: ESOPHAGOGASTRODUODENOSCOPY (EGD);  Surgeon: Pasty Spillers, MD;  Location: [facility] ENDOSCOPY;  Service: Endoscopy;  Laterality: N/A;   Family History: History reviewed. No pertinent family history. Family Psychiatric  History: father with mental illness Social History:  Social History   Substance and Sexual Activity  Alcohol Use No     Social History   Substance and Sexual Activity  Drug Use Yes  . Types: Marijuana    Social History   Socioeconomic History  . Marital status: Single    Spouse name: Not on file  . Number of children: Not on file  . Years of education: Not on file  . Highest education level: Not on file  Occupational History  . Not on file  Social Needs  . Financial resource strain: Not on file  . Food insecurity:    Worry: Not on file    Inability: Not on file  . Transportation needs:    Medical: Not on file    Non-medical: Not on file  Tobacco Use  . Smoking status: Current Some Day Smoker    Packs/day: 1.00    Types: Cigarettes  . Smokeless tobacco: Never Used  Substance and Sexual Activity  . Alcohol use: No  . Drug use: Yes  Types: Marijuana  . Sexual activity: Never  Lifestyle  . Physical activity:    Days per week: Not on file    Minutes per session: Not on file  . Stress: Not on file  Relationships  . Social connections:    Talks on phone: Not on file    Gets together: Not on file    Attends religious service: Not on file    Active member of club or organization: Not on file    Attends meetings of clubs or organizations: Not on file    Relationship status: Not on  file  Other Topics Concern  . Not on file  Social History Narrative  . Not on file   Additional Social History:                         Sleep: Fair  Appetite:  Fair  Current Medications: Current Facility-Administered Medications  Medication Dose Route Frequency Provider Last Rate Last Dose  . 0.9 %  sodium chloride infusion   Intravenous Continuous Melodie Bouillon B, MD 20 mL/hr at 06/17/18 1120    . acetaminophen (TYLENOL) tablet 650 mg  650 mg Oral Q6H PRN Clapacs, Jackquline Denmark, MD   650 mg at 06/10/18 2256  . ALPRAZolam (XANAX) tablet 1 mg  1 mg Oral BID Isadore Bokhari B, MD   1 mg at 06/18/18 0755  . alum & mag hydroxide-simeth (MAALOX/MYLANTA) 200-200-20 MG/5ML suspension 30 mL  30 mL Oral Q4H PRN Clapacs, John T, MD      . chlorproMAZINE (THORAZINE) injection 50 mg  50 mg Intramuscular TID PRN Karolyna Bianchini B, MD   50 mg at 06/17/18 1523  . cloZAPine (CLOZARIL) tablet 50 mg  50 mg Oral QHS Promyse Ardito B, MD      . LORazepam (ATIVAN) injection 2 mg  2 mg Intramuscular Q8H PRN Beverly Sessions, MD   2 mg at 06/18/18 1610  . LORazepam (ATIVAN) tablet 2 mg  2 mg Oral Q6H PRN Sakiya Stepka B, MD   2 mg at 06/17/18 2110  . magnesium hydroxide (MILK OF MAGNESIA) suspension 30 mL  30 mL Oral Daily PRN Clapacs, Jackquline Denmark, MD   30 mL at 06/16/18 2045  . nicotine polacrilex (NICORETTE) gum 2 mg  2 mg Oral PRN Alitza Cowman B, MD   2 mg at 06/15/18 1441  . NON FORMULARY 1 Inhaler  1 Inhaler Oral Daily PRN Deashia Soule B, MD      . Oxcarbazepine (TRILEPTAL) tablet 300 mg  300 mg Oral BID Reymond Maynez B, MD   300 mg at 06/18/18 0755  . QUEtiapine (SEROQUEL) tablet 50 mg  50 mg Oral QHS Justis Dupas B, MD   50 mg at 06/17/18 2103  . tamsulosin (FLOMAX) capsule 0.4 mg  0.4 mg Oral QPC breakfast Rukaya Kleinschmidt B, MD   0.4 mg at 06/18/18 0913  . temazepam (RESTORIL) capsule 30 mg  30 mg Oral QHS PRN Brucha Ahlquist B, MD   30 mg at  06/17/18 2312  . ziprasidone (GEODON) injection 20 mg  20 mg Intramuscular BID PRN Vidit Boissonneault B, MD   20 mg at 06/18/18 0548    Lab Results: No results found for this or any previous visit (from the past 48 hour(s)).  Blood Alcohol level:  Lab Results  Component Value Date   [facility] <10 06/06/2018   ETH <5 12/13/2015    Metabolic Disorder Labs: Lab Results  Component Value Date  HGBA1C 4.5 (L) 06/09/2018   MPG 82.45 06/09/2018   MPG 100 08/16/2014   No results found for: PROLACTIN Lab Results  Component Value Date   CHOL 140 06/09/2018   TRIG 50 06/09/2018   HDL 59 06/09/2018   CHOLHDL 2.4 06/09/2018   VLDL 10 06/09/2018   LDLCALC 71 06/09/2018   LDLCALC 57 08/16/2014    Physical Findings: AIMS:  , ,  ,  ,    CIWA:    COWS:     Musculoskeletal: Strength & Muscle Tone: within normal limits Gait & Station: normal Patient leans: N/A  Psychiatric Specialty Exam: Physical Exam  Nursing note and vitals reviewed. Psychiatric: His speech is normal and behavior is normal. Thought content normal. His affect is labile. Cognition and memory are impaired. He expresses impulsivity.    Review of Systems  Neurological: Negative.   All other systems reviewed and are negative.   Blood pressure 102/63, pulse 96, temperature 98 F (36.7 C), temperature source Oral, resp. rate 18, height 5' 2.01" (1.575 m), weight 49 kg, SpO2 98 %.Body mass index is 19.75 kg/m.  General Appearance: Casual  Eye Contact:  Good  Speech:  Clear and Coherent  Volume:  Normal  Mood:  Irritable  Affect:  Congruent  Thought Process:  Goal Directed and Descriptions of Associations: Intact  Orientation:  Full (Time, Place, and Person)  Thought Content:  WDL  Suicidal Thoughts:  No  Homicidal Thoughts:  No  Memory:  Immediate;   Fair Recent;   Fair Remote;   Fair  Judgement:  Poor  Insight:  Lacking  Psychomotor Activity:  Increased  Concentration:  Concentration: Poor and Attention Span:  Poor  Recall:  Poor  Fund of Knowledge:  Poor  Language:  Fair  Akathisia:  No  Handed:  Right  AIMS (if indicated):     Assets:  Communication Skills Desire for Improvement Financial Resources/Insurance Housing Resilience  ADL's:  Intact  Cognition:  WNL  Sleep:  Number of Hours: 6.25     Treatment Plan Summary: Daily contact with patient to assess and evaluate symptoms and progress in treatment and Medication management   Christian Alexander is a 17 year old male with a history of mood instability and self injurious behavior admitted for repeated suicide attempts while in jail.  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 Pt has  the guardian. He continues to swallow foreign objects when possible. He gave me a long description of ways he hurts himself including sticking objects up hi anus. "I am not stupid, I put it deep, deep up."  #Agitation, not improved -1:1 sitterPLUSsecurity guard  -oral Haldol, Ativan available -Geodon 20 mg IM BID PRN -Thorazine  IM PRN  -pharmacy received Adasuve, loxapine inhaler. This can be used once daily for agitation with caution as it may cause bronchospasm   #Mood/psychosis, not improving -continueSeroquel 50 mg nightly -continueXanax 1 mg BID Continue Trileptalto 300mg  BID -increase Clozapine to 50 mg nightly tonight 10/21  #Foreign body in the gut and rectum -surgery and gastroenterology input is appreciated -follow up Xray tomorrow  #Constipation -will give Miralax tonight  #Insomnia, slept 6 hours -Restoril 30 mg PRN insomnia  #Status post Ross procedure -Cardiac ECHO completed on 06/11/2018  #Urethra foreign bodyinsertion -cystoscopy did not reveal foreign body -urology input is greatly appreciated -self cath for urinary retention -no indication for Flomax but patient claims it helps  #Smoking cessation -nicorette gum PRN  #Labs -lipid panel, TSH, A1C are  normal  #Social -incompetent  adult -[place]  #Disposition -awaiting bed at [facility], made priority list,  -he will return to his group home if he improves  Kristine Linea, MD 06/18/2018, 5:02 PM

## 2018-06-18 NOTE — Progress Notes (Signed)
Recreation Therapy Notes  Date: 06/18/2018  Time: 9:30 am   Location: Craft room   Behavioral response: N/A   Intervention Topic: Stress  Discussion/Intervention: Patient did not attend group.   Clinical Observations/Feedback:  Patient did not attend group.   [name] LRT/CTRS        Christian Alexander 06/18/2018 12:26 PM 

## 2018-06-18 NOTE — Progress Notes (Signed)
Nursing 1:1 note D:Pt observed sitting in hall interacting with staff. Patient is in a pleasant mood. A: 1:1 observation continues for safety  R: pt remains safe

## 2018-06-18 NOTE — Progress Notes (Signed)
Patient at this time observed in his room with 1:1 BHT present for safety and security. Patient is observed sleeping with no signs of distress. Patient respirations unlabored and even. 

## 2018-06-18 NOTE — Progress Notes (Signed)
Patient at this time observed in his room with 1:1 BHT present for safety and security. Patient is observed sleeping with no signs of distress. Patient respirations unlabored and even.

## 2018-06-18 NOTE — Progress Notes (Signed)
Nursing 1:1 note D:Pt observed sitting in hall in recliner interacting with staff.  A: 1:1 observation continues for safety  R: pt remains safe

## 2018-06-18 NOTE — Progress Notes (Signed)
Nursing 1:1 note D:Pt observed in room expressing feeling anxious and need for medication. A: 1:1 observation continues for safety  R: pt remains safe

## 2018-06-18 NOTE — Progress Notes (Signed)
Nursing 1:1 note D:Pt in room taking a shower. Patient redirectable at this time.  A: 1:1 observation continues for safety  R: pt remains safe

## 2018-06-18 NOTE — Progress Notes (Signed)
Nursing 1:1 note D:Pt observed sitting in hallway in recliner with staff. Patient is calm and cooperative at this time. A: 1:1 observation continues for safety  R: pt remains safe

## 2018-06-18 NOTE — Progress Notes (Signed)
Patient at this time observed in his room sleeping with 1:1 BHT and security present. Pt. Respirations unlabored and WDL. Pt. Observed in no distress at this time. Will continue to monitor.

## 2018-06-18 NOTE — Progress Notes (Signed)
Nursing 1:1 note D:Pt observed sitting in hallway in recliner with staff. Patient is calm and cooperative at this time. A: 1:1 observation continues for safety  R: pt remains safe  

## 2018-06-18 NOTE — Progress Notes (Signed)
Nursing 1:1 note D:Pt observed sleeping in bed with eyes closed. RR even and unlabored. No distress noted. A: 1:1 observation continues for safety  R: pt remains safe  

## 2018-06-18 NOTE — Consult Note (Signed)
SURGICAL CONSULTATION NOTE   HISTORY OF PRESENT ILLNESS (HPI):  17 y.o. male admitted at behavioral ward due to mood instability and self injurious behavior. Patient reports he introduced the cartboard of the toilet paper rectally yesterday, Denies abdominal pain. Refers rectal pain. Pain does not radiates. No aggravator or alleviator factor identified. Refers history of constipation.   Surgery is consulted by Dr. Jennet Maduro in this context for evaluation and management of rectal foreign object.  PAST MEDICAL HISTORY (PMH):  Past Medical History:  Diagnosis Date  . Aortic valve stenosis   . Attention deficit disorder (ADD)   . ODD (oppositional defiant disorder)      PAST SURGICAL HISTORY (PSH):  Past Surgical History:  Procedure Laterality Date  . CARDIAC SURGERY    . ESOPHAGOGASTRODUODENOSCOPY N/A 06/17/2018   Procedure: ESOPHAGOGASTRODUODENOSCOPY (EGD);  Surgeon: Pasty Spillers, MD;  Location: [facility] ENDOSCOPY;  Service: Endoscopy;  Laterality: N/A;     MEDICATIONS:  Prior to Admission medications   Medication Sig Start Date End Date Taking? Authorizing Provider  naltrexone (DEPADE) 50 MG tablet Take 50 mg by mouth daily. 04/30/18   [provider]  Oxcarbazepine (TRILEPTAL) 300 MG tablet Take 300-900 mg by mouth See admin instructions. Take 1 tablet (300MG ) by mouth every morning and lunchtime and take 3 tablets (900MG ) by mouth every night at bedtime 04/30/18   [provider]  QUEtiapine (SEROQUEL) 100 MG tablet Take 50-200 mg by mouth See admin instructions. Take  tablet (50MG ) by mouth every morning and 2 tablets (200MG ) by mouth every night at bedtime 04/30/18   [provider]  tamsulosin (FLOMAX) 0.4 MG CAPS capsule Take 0.4 mg by mouth.    [provider]     ALLERGIES:  No Known Allergies   SOCIAL HISTORY:  Social History   Socioeconomic History  . Marital status: Single    Spouse name: Not on file  . Number of children: Not  on file  . Years of education: Not on file  . Highest education level: Not on file  Occupational History  . Not on file  Social Needs  . Financial resource strain: Not on file  . Food insecurity:    Worry: Not on file    Inability: Not on file  . Transportation needs:    Medical: Not on file    Non-medical: Not on file  Tobacco Use  . Smoking status: Current Some Day Smoker    Packs/day: 1.00    Types: Cigarettes  . Smokeless tobacco: Never Used  Substance and Sexual Activity  . Alcohol use: No  . Drug use: Yes    Types: Marijuana  . Sexual activity: Never  Lifestyle  . Physical activity:    Days per week: Not on file    Minutes per session: Not on file  . Stress: Not on file  Relationships  . Social connections:    Talks on phone: Not on file    Gets together: Not on file    Attends religious service: Not on file    Active member of club or organization: Not on file    Attends meetings of clubs or organizations: Not on file    Relationship status: Not on file  . Intimate partner violence:    Fear of current or ex partner: Not on file    Emotionally abused: Not on file    Physically abused: Not on file    Forced sexual activity: Not on file  Other Topics Concern  .  Not on file  Social History Narrative  . Not on file    The patient currently resides (home / rehab facility / nursing home): Home The patient normally is (ambulatory / bedbound): Ambulatory   FAMILY HISTORY:  History reviewed. No pertinent family history.   REVIEW OF SYSTEMS:  Constitutional: denies weight loss, fever, chills, or sweats  Eyes: denies any other vision changes, history of eye injury  ENT: denies sore throat, hearing problems  Respiratory: denies shortness of breath, wheezing  Cardiovascular: denies chest pain, palpitations  Gastrointestinal: denies abdominal pain, N/V, diarrhea Genitourinary: denies burning with urination or urinary frequency Musculoskeletal: denies any other  joint pains or cramps  Skin: denies any other rashes or skin discolorations  Neurological: denies any other headache, dizziness, weakness  Psychiatric: denies any other depression, anxiety   All other review of systems were negative   VITAL SIGNS:  Temp:  [98 F (36.7 C)-98.6 F (37 C)] 98 F (36.7 C) (10/21 0610) Pulse Rate:  [92-96] 96 (10/21 0610) Resp:  [17-18] 18 (10/21 0610) BP: (102-109)/(63) 102/63 (10/21 0610) SpO2:  [98 %-99 %] 98 % (10/21 0610)     Height: 5' 2.01" (157.5 cm) Weight: 49 kg BMI (Calculated): 19.75   INTAKE/OUTPUT:  This shift: No intake/output data recorded.  Last 2 shifts: @IOLAST2SHIFTS @   PHYSICAL EXAM:  Constitutional:  -- Normal body habitus  -- Awake, alert, and oriented x3  Eyes:  -- Pupils equally round and reactive to light  -- No scleral icterus  Ear, nose, and throat:  -- No jugular venous distension  Pulmonary:  -- No crackles  -- Equal breath sounds bilaterally -- Breathing non-labored at rest Cardiovascular:  -- S1, S2 present  -- No pericardial rubs Gastrointestinal:  -- Abdomen soft, nontender, non-distended, no guarding or rebound tenderness -- No abdominal masses appreciated, pulsatile or otherwise  -- Rectal: adequate rectal tone. Palpation of anal canal unremarkable. Patient did not let deeper digital rectal exam. No sing of bleeding or trauma to anus or anal canal.  Musculoskeletal and Integumentary:  -- Wounds or skin discoloration: None appreciated -- Extremities: B/L UE and LE FROM, hands and feet warm, no edema  Neurologic:  -- Motor function: intact and symmetric -- Sensation: intact and symmetric   Labs:  CBC Latest Ref Rng & Units 06/13/2018 06/06/2018 12/13/2015  WBC 4.0 - 10.5 K/uL 10.2 7.9 11.2(H)  Hemoglobin 13.0 - 17.0 g/dL 16.1 09.6 04.5  Hematocrit 39.0 - 52.0 % 42.3 42.2 40.9  Platelets 150 - 400 K/uL 160 171 243   CMP Latest Ref Rng & Units 06/06/2018 12/13/2015 12/08/2015  Glucose 70 - 99 mg/dL 95  409(W) 119(J)  BUN 6 - 20 mg/dL 15 9 13   Creatinine 0.61 - 1.24 mg/dL 4.78 2.95 6.21  Sodium 135 - 145 mmol/L 142 137 133(L)  Potassium 3.5 - 5.1 mmol/L 3.7 3.7 3.9  Chloride 98 - 111 mmol/L 103 108 103  CO2 22 - 32 mmol/L 29 24 22   Calcium 8.9 - 10.3 mg/dL 9.5 9.7 9.5  Total Protein 6.5 - 8.1 g/dL 7.7 7.4 7.0  Total Bilirubin 0.3 - 1.2 mg/dL 0.6 0.5 3.0(Q)  Alkaline Phos 38 - 126 U/L 54 72 78  AST 15 - 41 U/L 25 21 37  ALT 0 - 44 U/L 19 17 21    Imaging studies:  EXAM: CT ABDOMEN AND PELVIS WITH CONTRAST  TECHNIQUE: Multidetector CT imaging of the abdomen and pelvis was performed using the standard protocol following bolus administration of  intravenous contrast.  CONTRAST:  75mL OMNIPAQUE IOHEXOL 300 MG/ML  SOLN  COMPARISON:  Abdominal CT earlier this day at 0903 hour  FINDINGS: Lower chest: Clear.  Hepatobiliary: No focal hepatic abnormality. Gallbladder is contracted without calcified gallstone or pericholecystic inflammation.  Pancreas: No ductal dilatation or inflammation.  Spleen: Normal in size without focal abnormality.  Adrenals/Urinary Tract: Adrenal glands are unremarkable. Kidneys are normal, without renal calculi, focal lesion, or hydronephrosis. Bladder is unremarkable.  Stomach/Bowel: The stomach is distended with ingested material, including scattered radiopaque densities, without specific configuration. Punctate radiopaque density in small bowel in the right lower abdomen (image 51 series 2), not seen previously.  No rectal or distal colonic radiopaque foreign body. Rectum is nondistended.  No bowel obstruction or inflammation. Moderate volume of stool throughout the colon. Normal appendix.  Vascular/Lymphatic: No significant vascular findings are present. No enlarged abdominal or pelvic lymph nodes.  Reproductive: Prostate is unremarkable.  Other: Small amount of free fluid in the pelvis, unchanged to mildly decreased from  prior exam. No free air or intra-abdominal abscess.  Musculoskeletal: Again seen scoliotic curvature of spine. No acute osseous abnormality. Linear air in the right gluteus musculature, pattern typically related to IV placement.  IMPRESSION: 1. No rectal foreign body. 2. Ingested material in the stomach which contains radiopaque densities, nonspecific. These may simply represent pills, however given history of foreign body ingestion, underlying etiology is indeterminate. Additionally there is a punctate density in the small bowel in the right mid abdomen of uncertain etiology. No bowel inflammation or perforation. 3. Small amount of free fluid in the pelvis is unchanged to mildly decreased from exam earlier this day.   Electronically Signed   By: Narda Rutherford M.D.   On: 06/17/2018 23:29  Assessment/Plan:  18 y.o. male with personal allegation or introducing rectal foreign object (toilet cardboard), complicated by pertinent comorbidities including Bipolar I disorder, Suicidal ideation, Homicidal ideation, mood instability and self injurious behavior. Patient with recent gastric foreign object removed by endoscopy. Patient with Xray, CT scan and rectal exam without evidence of foreign object. Patient is constipated and having treatment for that matter. Agree to continue constipation management. There is no sign of bowel obstruction, bowel perforation, rectal bleeding or foreign object. No surgical management indicated at this moment.  Case discussed with Psychiatrist.   Gae Gallop, MD

## 2018-06-18 NOTE — Progress Notes (Signed)
Nursing 1:1 note D:Pt observed sitting in recliner in hall interacting with staff A: 1:1 observation continues for safety  R: pt remains safe

## 2018-06-18 NOTE — Progress Notes (Signed)
Nursing 1:1 note D:Pt observed sitting in hall interacting with staff. Behavior remains appropriate. A: 1:1 observation continues for safety  R: pt remains safe

## 2018-06-18 NOTE — Progress Notes (Signed)
[facility] MD Progress Note  06/19/2018 1:47 PM Chanoch Mccleery  MRN:  [id number]  Subjective:    Mr. Saquan was in better control of his behavior yesterday when he brought up all medical problems and was the center of attention. This morning he was again agitated and threatening. Received Geodon 20 mg IM injection.  Xray negative. No BM so far.  Spoke witgh the guardian. The patient cursed her out this morning and she had to put the phone down.   Principal Problem: Bipolar I disorder, most recent episode depressed, severe without psychotic features (HCC) Diagnosis:   Patient Active Problem List   Diagnosis Date Noted  . Bipolar I disorder, most recent episode depressed, severe without psychotic features (HCC) [F31.4] 06/08/2018    Priority: High  . Gastric foreign body [T18.2XXA]   . Foreign body in alimentary tract [T18.9XXA]   . 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 Bipolar 1 disorder, depressed, moderate (HCC) [F31.32] 08/15/2014  . 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 ODD (oppositional defiant disorder) [F91.3] 08/15/2014  . 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 Homicidal ideation [R45.850]   . Suicidal ideation [R45.851]    Total Time spent with patient: 20 minutes  Past Psychiatric History: bipolar disorder   Past Medical History:  Past Medical History:  Diagnosis Date  . Aortic valve stenosis   . Attention deficit disorder (ADD)   . ODD (oppositional defiant disorder)     Past Surgical History:  Procedure Laterality Date  . CARDIAC SURGERY    . ESOPHAGOGASTRODUODENOSCOPY N/A 06/17/2018   Procedure: ESOPHAGOGASTRODUODENOSCOPY (EGD);  Surgeon: Pasty Spillers, MD;  Location: [facility] ENDOSCOPY;  Service: Endoscopy;  Laterality: N/A;   Family History: History reviewed. No pertinent family history. Family  Psychiatric  History: none Social History:  Social History   Substance and Sexual Activity  Alcohol Use No     Social History   Substance and Sexual Activity  Drug Use Yes  . Types: Marijuana    Social History   Socioeconomic History  . Marital status: Single    Spouse name: Not on file  . Number of children: Not on file  . Years of education: Not on file  . Highest education level: Not on file  Occupational History  . Not on file  Social Needs  . Financial resource strain: Not on file  . Food insecurity:    Worry: Not on file    Inability: Not on file  . Transportation needs:    Medical: Not on file    Non-medical: Not on file  Tobacco Use  . Smoking status: Current Some Day Smoker    Packs/day: 1.00    Types: Cigarettes  . Smokeless tobacco: Never Used  Substance and Sexual Activity  . Alcohol use: No  . Drug use: Yes    Types: Marijuana  . Sexual activity: Never  Lifestyle  . Physical activity:    Days per week: Not on file    Minutes per session: Not on file  . Stress: Not on file  Relationships  . Social connections:    Talks on phone: Not on file    Gets together: Not on file    Attends religious service: Not on file    Active member of club or organization: Not on file    Attends meetings of clubs or organizations: Not on file    Relationship status:  Not on file  Other Topics Concern  . Not on file  Social History Narrative  . Not on file   Additional Social History:                         Sleep: Fair  Appetite:  Fair  Current Medications: Current Facility-Administered Medications  Medication Dose Route Frequency Provider Last Rate Last Dose  . 0.9 %  sodium chloride infusion   Intravenous Continuous Melodie Bouillon B, MD 20 mL/hr at 06/17/18 1120    . acetaminophen (TYLENOL) tablet 650 mg  650 mg Oral Q6H PRN Clapacs, Jackquline Denmark, MD   650 mg at 06/10/18 2256  . ALPRAZolam (XANAX) tablet 1 mg  1 mg Oral BID Pucilowska, Jolanta B,  MD   1 mg at 06/19/18 0802  . alum & mag hydroxide-simeth (MAALOX/MYLANTA) 200-200-20 MG/5ML suspension 30 mL  30 mL Oral Q4H PRN Clapacs, John T, MD      . chlorproMAZINE (THORAZINE) injection 50 mg  50 mg Intramuscular TID PRN Pucilowska, Jolanta B, MD   50 mg at 06/17/18 1523  . cloZAPine (CLOZARIL) tablet 50 mg  50 mg Oral QHS Pucilowska, Jolanta B, MD   50 mg at 06/18/18 2044  . LORazepam (ATIVAN) injection 2 mg  2 mg Intramuscular Q8H PRN Beverly Sessions, MD   2 mg at 06/18/18 9562  . LORazepam (ATIVAN) tablet 2 mg  2 mg Oral Q6H PRN Pucilowska, Jolanta B, MD   2 mg at 06/17/18 2110  . magnesium hydroxide (MILK OF MAGNESIA) suspension 30 mL  30 mL Oral Daily PRN Clapacs, Jackquline Denmark, MD   30 mL at 06/16/18 2045  . nicotine polacrilex (NICORETTE) gum 2 mg  2 mg Oral PRN Pucilowska, Jolanta B, MD   2 mg at 06/15/18 1441  . NON FORMULARY 1 Inhaler  1 Inhaler Oral Daily PRN Pucilowska, Jolanta B, MD      . Oxcarbazepine (TRILEPTAL) tablet 300 mg  300 mg Oral BID Pucilowska, Jolanta B, MD   300 mg at 06/19/18 0802  . polyethylene glycol (MIRALAX / GLYCOLAX) packet 17 g  17 g Oral Daily Pucilowska, Jolanta B, MD   17 g at 06/19/18 0802  . tamsulosin (FLOMAX) capsule 0.4 mg  0.4 mg Oral QPC breakfast Pucilowska, Jolanta B, MD   0.4 mg at 06/19/18 0803  . temazepam (RESTORIL) capsule 30 mg  30 mg Oral QHS PRN Pucilowska, Jolanta B, MD   30 mg at 06/18/18 2043  . ziprasidone (GEODON) injection 20 mg  20 mg Intramuscular BID PRN Pucilowska, Jolanta B, MD   20 mg at 06/19/18 0934    Lab Results: No results found for this or any previous visit (from the past 48 hour(s)).  Blood Alcohol level:  Lab Results  Component Value Date   ETH <10 06/06/2018   ETH <5 12/13/2015    Metabolic Disorder Labs: Lab Results  Component Value Date   HGBA1C 4.5 (L) 06/09/2018   MPG 82.45 06/09/2018   MPG 100 08/16/2014   No results found for: PROLACTIN Lab Results  Component Value Date   CHOL 140 06/09/2018    TRIG 50 06/09/2018   HDL 59 06/09/2018   CHOLHDL 2.4 06/09/2018   VLDL 10 06/09/2018   LDLCALC 71 06/09/2018   LDLCALC 57 08/16/2014    Physical Findings: AIMS:  , ,  ,  ,    CIWA:    COWS:     Musculoskeletal: Strength &  Muscle Tone: within normal limits Gait & Station: normal Patient leans: N/A  Psychiatric Specialty Exam: Physical Exam  Nursing note and vitals reviewed.   ROS  Blood pressure 101/72, pulse 97, temperature 97.7 F (36.5 C), temperature source Oral, resp. rate 18, height 5' 2.01" (1.575 m), weight 49 kg, SpO2 99 %.Body mass index is 19.75 kg/m.  General Appearance: Casual  Eye Contact:  Good  Speech:  Pressured  Volume:  Increased  Mood:  Dysphoric and Irritable  Affect:  Congruent  Thought Process:  Irrelevant and Descriptions of Associations: Tangential  Orientation:  Full (Time, Place, and Person)  Thought Content:  WDL  Suicidal Thoughts:  No  Homicidal Thoughts:  No  Memory:  Immediate;   Fair Recent;   Fair Remote;   Fair  Judgement:  Poor  Insight:  Lacking  Psychomotor Activity:  Increased  Concentration:  Concentration: Fair and Attention Span: Fair  Recall:  [employer] of Knowledge:  Fair  Language:  Fair  Akathisia:  No  Handed:  Right  AIMS (if indicated):     Assets:  Communication Skills Desire for Improvement Financial Resources/Insurance Housing Physical Health Resilience Social Support  ADL's:  Intact  Cognition:  WNL  Sleep:  Number of Hours: 7.75     Treatment Plan Summary: Daily contact with patient to assess and evaluate symptoms and progress in treatment and Medication management   Mr. Class is a 17 year old male with a history of mood instability and self injurious behavior admitted for repeated suicide attempts while in jail. currently 1:1sitter andsecuritystaff, and receives PRNs daily. Pt has the guardian.  #Agitation, improving -1:1 sitterPLUSsecurity guard -oral Haldol, Ativan  available -Geodon 20 mg IM BID PRN -Thorazine IM PRN  -pharmacy received Adasuve, loxapine inhaler. This can be used once daily for agitation with caution as it may cause bronchospasm   #Mood/psychosis, not improving -discontinueSeroquel  -continueXanax 1 mg BID Continue Trileptalto 300mg  BID -increase Clozapine to 50 mg nightly on 10/21  #Foreign body in the gut and rectum -surgery and gastroenterology input is appreciated -follow up Xray is negative today 10/22  #Constipation -Miralax PRN  #Insomnia, slept 8 hours -Restoril 30 mg PRN insomnia  #Status post Ross procedure -Cardiac ECHO completed on 06/11/2018  #Urethra foreign bodyinsertion -cystoscopy did not reveal foreign body -urology input is greatly appreciated -self cath for urinary retention -no indication for Flomax but patient claims it helps  #Smoking cessation -nicorette gum PRN  #Labs -lipid panel, TSH, A1C are normal  #Social -incompetent adult -[place]  #Disposition -awaiting bed [facility], made priority list, -he will return to his group homeif he improves  Kristine Linea, MD 06/19/2018, 1:47 PM

## 2018-06-18 NOTE — BH Assessment (Signed)
Writer faxed updated information about patient's behavior to [facility] ([phone]). Information Faxed: All BH progress notes time stamped 06/15/2018 6:36am - 06/18/2018 7:00am. Confirmed faxed was received, pending review and pt remains on Priority Wait List.  [facility] Intake RN) confirmed fax successfully received. He reports No Beds Available at this time. Possible beds available tomorrow or Wednesday.  Will continue to fax BH progress notes in the meantime.

## 2018-06-18 NOTE — Progress Notes (Signed)
Nursing 1:1 note D:Pt observed sitting in hall sitting in recliner interacting with staff. Patient remains pleasant and cooperative. A: 1:1 observation continues for safety  R: pt remains safe

## 2018-06-18 NOTE — Progress Notes (Signed)
Patient at this time awake and up in his room and in the hallway expressing agitations to this Clinical research associate. Patient this morning was able to verbalize irritability and agitations and requests PRN medications to calm down. Pt. Encouraged to utilize coping skills, but continues to fixate on CT scanning procedure and continues to ask questions and get more agitated such as, "so what are they going to do about the stuff in my butt?", "are they going to use some kind of tool to get the stuff out?". Per patient request this writer gave PRN medications for patient comfort and safety. Will continue to monitor.

## 2018-06-18 NOTE — Progress Notes (Signed)
Patient up at this time in his room sitting on his mattress talking to BHT present for safety. Patient when engaged at this time to assess the patient's status, is visibly agitated and expresses repeatedly he desires to know the results of his CT scan. Pt. Given patient education that MD needs to review CT scan results and will be able to speak with him when he sees the patient during the day. Pt. Offered PRN medications to reduce agitations, but patient reports he is going to return to sleep and will be, "I'm alright for now". Pt. Educated to verbalize the need for PRN medications to maintain control as to not self harm, not hurt others, or be uncomfortably agitated. Will continue to monitor.

## 2018-06-18 NOTE — Progress Notes (Signed)
Patient observed at this time with this RN, 1:1 BHT, and 1:1 security guard up in the hallway and back hallway day room eating a snack. Pt. At this time not expressing further agitations, but mood is labile and irritable.

## 2018-06-18 NOTE — Plan of Care (Signed)
Patient monitored by staff on 1:1 for safety. Pt. Able to remain safe at this time with enhanced precautions. Patient does continue to have suicidal thoughts. Pt. Non-accepting of provided education. Pt. Continues to present irritable, anxious, and/or agitated frequently, requiring constant staff intervention and attention.    Problem: Safety: Goal: Ability to remain free from injury will improve Outcome: Progressing   Problem: Education: Goal: Knowledge of [facility] Education information/materials will improve Outcome: Not Progressing Goal: Emotional status will improve Outcome: Not Progressing Goal: Mental status will improve Outcome: Not Progressing

## 2018-06-18 NOTE — Progress Notes (Signed)
D: Upon arrival to the unit patient asleep in his room with even respirations that are unlabored presenting with no distress.   Patient upon assessments later in the evening is visibly anxious and irritable, but willing to engage with this Clinical research associate. Pt. Educated to communicate his need for PRN medications to aid in maintaining control that the patient verbalizes understanding, but otherwise is non-accepting of other education provided. Patient was initially very hostile/agitated towards receiving CT scan with IV needing to be placed, but later was complaint and accepting of IV placement for procedure. CT scan was able to be complete with data available to be reviewed by MD. Pt. Continues to endorse active suicidal ideations, but reportedly no current plan. Pt. Does contract for safety with this Clinical research associate despite ideations. Pt. Denies HI/AVH. Pt. Denies depression, just reported anxiety during assessments. Pt. Mood is labile. On Call MD notified that patient reports during assessments, "I only stuck cardboard up my butt, I swear".   A: Patient continuous 1:1 observation maintained for safety and security. Patient was provided with education, but is very non-accepting of most of the education provided to him.  Patient was given/offered medications per orders. Pt. Chart and plans of care reviewed. Pt. Given support and encouragement frequently.   R: Patient is complaint with medication and procedures with direction and encouragement from staff. Pt. Needs frequent verbal contacts by this writer to aid in maintaining control. Pt. Given/offered frequent PRN medications to aid in maintaining control of agitations, self harm thoughts, and anxieties, as well as aid for sleep.           Precautionary 1:1 sitter for safety maintained, room free of safety hazards, patient sustains no injury or falls during this shift. Will endorse care to next shift.  Patient Observation   Pt. Woke up agitated in the early am  (roughly 3 am) fixated on his CT scan results. Pt. Redirectable and encouraged to speak to the provider in the morning regarding results. Pt. Noticeably agitated During this interaction and offered PRN Medications for comfort, but declined. Pt. Returned to sleep.

## 2018-06-18 NOTE — Progress Notes (Signed)
Christian Alexander , MD [address], [place], [place], [address] [address], [address], [place], [place], [address] Phone: [phone]  Fax: [phone]   Christian Alexander is being followed for foreign body ingestion  Day 1 of follow up   Subjective: Says he is feeling fine was drinking hot chocolate when I went to see him. No complaints.  Insists he inserted an object into his rectum last night   Objective: Vital signs in last 24 hours: Vitals:   06/17/18 1152 06/17/18 1202 06/17/18 2116 06/18/18 0610  BP: (!) 98/52 (!) 98/50 109/63 102/63  Pulse: 85 84 92 96  Resp: (!) 24 18 17 18   Temp:   98.6 F (37 C) 98 F (36.7 C)  TempSrc:   Oral Oral  SpO2: 100% 100% 99% 98%  Weight:      Height:       Weight change:   Intake/Output Summary (Last 24 hours) at 06/18/2018 1627 Last data filed at 06/18/2018 0840 Gross per 24 hour  Intake 1320 ml  Output -  Net 1320 ml     Lab Results: @LABTEST2 @ Micro Results: No results found for this or any previous visit (from the past 240 hour(s)). Studies/Results: Dg Pelvis 1-2 Views  Result Date: 06/17/2018 CLINICAL DATA:  Inserted foreign body into rectum EXAM: PELVIS - 1-2 VIEW COMPARISON:  CT 06/17/2018 FINDINGS: No radiopaque foreign body evident within the pelvis. Hip joints and SI joints are symmetric and unremarkable. No bony abnormality. IMPRESSION: No visible radiopaque foreign body or acute bony abnormality. Electronically Signed   By: Charlett Nose M.D.   On: 06/17/2018 19:43   Ct Abdomen Pelvis W Contrast  Result Date: 06/17/2018 CLINICAL DATA:  Patient reports inserting something thin and long into anus/rectum. This occurred after CT 4 ingested plastic wrap of cookie yesterday. EXAM: CT ABDOMEN AND PELVIS WITH CONTRAST TECHNIQUE: Multidetector CT imaging of the abdomen and pelvis was performed using the standard protocol following bolus administration of intravenous contrast. CONTRAST:  75mL OMNIPAQUE IOHEXOL 300  MG/ML  SOLN COMPARISON:  Abdominal CT earlier this day at 0903 hour FINDINGS: Lower chest: Clear. Hepatobiliary: No focal hepatic abnormality. Gallbladder is contracted without calcified gallstone or pericholecystic inflammation. Pancreas: No ductal dilatation or inflammation. Spleen: Normal in size without focal abnormality. Adrenals/Urinary Tract: Adrenal glands are unremarkable. Kidneys are normal, without renal calculi, focal lesion, or hydronephrosis. Bladder is unremarkable. Stomach/Bowel: The stomach is distended with ingested material, including scattered radiopaque densities, without specific configuration. Punctate radiopaque density in small bowel in the right lower abdomen (image 51 series 2), not seen previously. No rectal or distal colonic radiopaque foreign body. Rectum is nondistended. No bowel obstruction or inflammation. Moderate volume of stool throughout the colon. Normal appendix. Vascular/Lymphatic: No significant vascular findings are present. No enlarged abdominal or pelvic lymph nodes. Reproductive: Prostate is unremarkable. Other: Small amount of free fluid in the pelvis, unchanged to mildly decreased from prior exam. No free air or intra-abdominal abscess. Musculoskeletal: Again seen scoliotic curvature of spine. No acute osseous abnormality. Linear air in the right gluteus musculature, pattern typically related to IV placement. IMPRESSION: 1. No rectal foreign body. 2. Ingested material in the stomach which contains radiopaque densities, nonspecific. These may simply represent pills, however given history of foreign body ingestion, underlying etiology is indeterminate. Additionally there is a punctate density in the small bowel in the right mid abdomen of uncertain etiology. No bowel inflammation or perforation. 3. Small amount of free fluid in the pelvis  is unchanged to mildly decreased from exam earlier this day. Electronically Signed   By: Narda Rutherford M.D.   On: 06/17/2018 23:29     Ct Abdomen Pelvis W Contrast  Result Date: 06/17/2018 CLINICAL DATA:  Foreign body ingestion. Patient ingested plastic wrap of cookies yesterday. EXAM: CT ABDOMEN AND PELVIS WITH CONTRAST TECHNIQUE: Multidetector CT imaging of the abdomen and pelvis was performed using the standard protocol following bolus administration of intravenous contrast. CONTRAST:  ISOVUE-300 IOPAMIDOL (ISOVUE-300) INJECTION 61% COMPARISON:  None. FINDINGS: Lower chest: The lung bases are clear without focal nodule, mass, or airspace disease. The heart size is normal. No significant pleural or pericardial effusion is present. Hepatobiliary: No focal liver abnormality is seen. No gallstones, gallbladder wall thickening, or biliary dilatation. Pancreas: Unremarkable. No pancreatic ductal dilatation or surrounding inflammatory changes. Spleen: Normal in size without focal abnormality. Adrenals/Urinary Tract: Adrenal glands are normal bilaterally. Kidneys and ureters are within normal limits. There is no stone or mass lesion. No obstruction is present. The urinary bladder is within normal limits. Stomach/Bowel: Stomach duodenum are within limits. No foreign body is identified. Small bowel is unremarkable. There is no obstruction. Terminal ileum is within normal limits. The appendix is visualized and normal. The ascending and transverse colon are within normal limits. The descending and sigmoid colon are normal. Vascular/Lymphatic: No significant vascular findings are present. No enlarged abdominal or pelvic lymph nodes. Reproductive: Prostate is unremarkable. Other: A small amount of free fluid layers in the anatomic pelvis. Free fluid or free air is present otherwise. There is no ventral hernia. Musculoskeletal: Levoconvex curvature of the lumbar spine is centered at L2-3. Vertebral body heights alignment are otherwise maintained. Pelvis is within normal limits. Hips are located and normal bilaterally. IMPRESSION: 1. No radiopaque  foreign body evident. 2. No obstruction free air. 3. Small a free fluid within the anatomic pelvis is nonspecific. This could be related to nonspecific enteritis. No discrete etiology is evident. 4. Lumbar scoliosis. Electronically Signed   By: Marin Roberts M.D.   On: 06/17/2018 09:29   Medications: I have reviewed the patient's current medications. Scheduled Meds: . ALPRAZolam  1 mg Oral BID  . cloZAPine  25 mg Oral QHS  . OXcarbazepine  300 mg Oral BID  . QUEtiapine  50 mg Oral QHS  . tamsulosin  0.4 mg Oral QPC breakfast   Continuous Infusions: . sodium chloride 20 mL/hr at 06/17/18 1120   PRN Meds:.acetaminophen, alum & mag hydroxide-simeth, chlorproMAZINE (THORAZINE) injection, LORazepam, LORazepam, magnesium hydroxide, nicotine polacrilex, NON FORMULARY 1 Inhaler, temazepam, ziprasidone   Assessment: Principal Problem:   Bipolar I disorder, most recent episode depressed, severe without psychotic features (HCC) Active Problems:   Suicidal ideation   Status post aortic valve replacement using Ross procedure   Gastric foreign body   Foreign body in alimentary tract   S/p EGD last night for foreign body ingestion that was taken out endoscopically.  CT abdomen shows foreign object in the small bowel , no object in the rectum . Appears comfortable at this point.    Plan: Check X ray abdomen tomorrow to ensure all objects in the small bowel have exited the body . If at any point he has abdominal pain/distension then would obtain abdominal imaging.    LOS: 10 days   Christian Mood, MD 06/18/2018, 4:27 PM

## 2018-06-19 ENCOUNTER — Inpatient Hospital Stay: Payer: Medicaid Other

## 2018-06-19 MED ORDER — POLYETHYLENE GLYCOL 3350 17 G PO PACK: 17.0000 g | PACK | Freq: Every day | ORAL | 0 refills | Status: AC

## 2018-06-19 MED ORDER — OXCARBAZEPINE 300 MG PO TABS: 300.0000 mg | ORAL_TABLET | Freq: Two times a day (BID) | ORAL | 0 refills | Status: DC

## 2018-06-19 MED ORDER — CLOZAPINE 50 MG PO TABS: 50.0000 mg | ORAL_TABLET | Freq: Every day | ORAL | 0 refills | Status: AC

## 2018-06-19 NOTE — Progress Notes (Signed)
Patient at this time observed in the back hallway with 1:1 BHT present and security guard for safety and security of the patient. Patient at this time is visibly agitated and posturing towards BHT walking passed the back hallway. Patient at this time is cursing loudly to himself and using threatening language towards BHT completing the 15 minute checks.

## 2018-06-19 NOTE — Progress Notes (Signed)
Patient at or around this time severely agitated and posturing towards staff member requiring administration of IM PRN medications to reduce agitations and discomfort of the patient for safety and security of the patient. Pt. Complaint with medication administration with vital signs taken shortly after that the patient is also able to be complaint with.

## 2018-06-19 NOTE — [facility] Suicide Risk Assessment (Addendum)
[facility] Discharge Suicide Risk Assessment   Principal Problem: Bipolar I disorder, most recent episode depressed, severe without psychotic features [facility]) Discharge Diagnoses:  Patient Active Problem List   Diagnosis Date Noted  . Bipolar I disorder, most recent episode depressed, severe without psychotic features (HCC) [F31.4] 06/08/2018    Priority: High  . Gastric foreign body [T18.2XXA]   . Foreign body in alimentary tract [T18.9XXA]   . 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 Bipolar 1 disorder, depressed, moderate (HCC) [F31.32] 08/15/2014  . 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 ODD (oppositional defiant disorder) [F91.3] 08/15/2014  . 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 Homicidal ideation [R45.850]   . Suicidal ideation [R45.851]     Total Time spent with patient: 20 minutes  Musculoskeletal: Strength & Muscle Tone: within normal limits Gait & Station: normal Patient leans: N/A  Psychiatric Specialty Exam: Review of Systems  Neurological: Negative.   Psychiatric/Behavioral: Negative.   All other systems reviewed and are negative.   Blood pressure 113/73, pulse 87, temperature 97.8 F (36.6 C), temperature source Oral, resp. rate 17, height 5' 2.01" (1.575 m), weight 49 kg, SpO2 100 %.Body mass index is 19.75 kg/m.  General Appearance: Casual  Eye Contact::  Good  Speech:  Clear and Coherent409  Volume:  Normal  Mood:  Angry, Dysphoric and Irritable  Affect:  Inappropriate and Labile  Thought Process:  Goal Directed and Descriptions of Associations: Intact  Orientation:  Full (Time, Place, and Person)  Thought Content:  Delusions and Paranoid Ideation  Suicidal Thoughts:  No  Homicidal Thoughts:  No  Memory:  Immediate;   Fair Recent;   Fair Remote;   Fair  Judgement:  Poor  Insight:  Lacking   Psychomotor Activity:  Increased  Concentration:  Fair  Recall:  [employer] of Knowledge:Fair  Language: Fair  Akathisia:  No  Handed:  Right  AIMS (if indicated):     Assets:  Communication Skills Desire for Improvement Financial Resources/Insurance Housing Physical Health Resilience  Sleep:  Number of Hours: 5.3  Cognition: WNL  ADL's:  Intact   Mental Status Per Nursing Assessment::   On Admission:  Self-harm behaviors  Demographic Factors:  Male, Adolescent or young adult and Caucasian  Loss Factors: NA  Historical Factors: Prior suicide attempts and Impulsivity  Risk Reduction Factors:   Living with another person, especially a relative  Continued Clinical Symptoms:  Bipolar Disorder:   Mixed State Currently Psychotic  Cognitive Features That Contribute To Risk:  None    Suicide Risk:  Minimal: No identifiable suicidal ideation.  Patients presenting with no risk factors but with morbid ruminations; may be classified as minimal risk based on the severity of the depressive symptoms    Plan Of Care/Follow-up recommendations:  Activity:  as tolerated Diet:  regular Other:  keep follow up appointments  Kristine Linea, MD 06/21/2018, 12:44 PM

## 2018-06-19 NOTE — Progress Notes (Signed)
Nursing 1:1 note D:Pt observed pacing the hall and interacting with staff. Respirations even and unlabored. No distress noted. A: 1:1 observation continues for safety  R: pt remains safe

## 2018-06-19 NOTE — Progress Notes (Signed)
Nursing 1:1 note D:Pt observed sitting in recliner interacting with staff. Respirations even and unlabored. No distress noted. Patient did walk hall occasionally. A: 1:1 observation continues for safety  R: pt remains safe

## 2018-06-19 NOTE — Progress Notes (Signed)
Patient at this time resting in the back hallway with 1:1 BHT and security guard present for safety and security. Pt. In no distress at this time. Pt. Respirations even and unlabored. No signs of distress at this time. Will continue to monitor for safety and security.

## 2018-06-19 NOTE — Progress Notes (Signed)
Nursing 1:1 note D:Pt observed pacing the hall. Patient is not in upset states that this helps him relax to pace the hall. Patient behavior is appropriate at this time. Patient is redirectable and is cooperating with staff. Monitoring continues. A:1:1 observation continues for safety  R: pt remains safe

## 2018-06-19 NOTE — Progress Notes (Signed)
Nursing 1:1 note D:Pt observed sitting in hall in recliner interacting with staff. Respirations even and unlabored. No distress noted. A: 1:1 observation continues for safety  R: pt remains safe

## 2018-06-19 NOTE — Progress Notes (Signed)
Nursing 1:1 note D:Pt observed sitting in recliner interacting with staff. Respirations even and unlabored. No distress noted. A: 1:1 observation continues for safety  R: pt remains safe

## 2018-06-19 NOTE — Progress Notes (Signed)
GI note   S/p EGD on Sunday for foreign body ingestion that was taken out endoscopically.   Repeat x ray abdomen shows no abnormality. Daily miralax for constipation , if no response can add lactulose 20 cc TID if needed   I will sign off.  Please call me if any further GI concerns or questions.  We would like to thank you for the opportunity to participate in the care of Christian Alexander.    LOS: 11 days   Wyline Mood, MD 06/19/2018, 10:13 AM

## 2018-06-19 NOTE — Plan of Care (Addendum)
Patient found sitting with 1:1 sitter, talking. Patient is compliant with 1:1 sitter. Patient mood and affect are irritable. Reports he feels that the doctor is not doing enough to remove the objects he placed in his rectum. When asked why he did that, patient is unable to answer other than to say, "I can't help it." Reports he has hemorrhoids. Reports he has been unable to have a BM since he placed a toilet paper roll and two wads of toilet paper in his anus. Patient reports he is able to feel them with his finger. Denies SI/HI/AVH at this time. Reports that self-harm thoughts are intermittent. AH are intermittent as well and he is denying all the present time. Patient is irritable but cooperative with staff. Compliant with HS medications. Will continue to monitor throughout the shift. Patient slept 7.75 hour. No apparent distress. Will endorse care to oncoming shift.  Problem: Coping: Goal: Level of anxiety will decrease Outcome: Progressing   Problem: Education: Goal: Emotional status will improve Outcome: Not Progressing Goal: Mental status will improve Outcome: Not Progressing Goal: Verbalization of understanding the information provided will improve Outcome: Not Progressing   Problem: Coping: Goal: Ability to interact with others will improve Outcome: Not Progressing   Problem: Activity: Goal: Interest or engagement in leisure activities will improve Outcome: Not Progressing

## 2018-06-19 NOTE — Progress Notes (Signed)
Nursing 1:1 note D:Pt observed sleeping in recliner with eyes closed. Respirations even and unlabored. No distress noted. A: 1:1 observation continues for safety  R: pt remains safe

## 2018-06-19 NOTE — Progress Notes (Signed)
Nursing 1:1 note D:Pt observed sitting in recliner in hall eating breakfast and interacting with staff. Patient took medications as ordered.Respirations even and unlabored. No distress noted. A: 1:1 observation continues for safety  R: pt remains safe

## 2018-06-19 NOTE — Progress Notes (Signed)
Nursing 1:1 note D:Pt observed sitting in recliner eating lunch and interacting with staff. Respirations even and unlabored. No distress noted. A: 1:1 observation continues for safety  R: pt remains safe

## 2018-06-19 NOTE — [facility] Counselor (Signed)
CSW spoke with the patients guardian Rebecka Apley [phone]). She requested a copy of the patients current medications to provide to [facility]. CSW provided her an update on the patient. Rebecka Apley requested that a conference call be arranged with her, Cradinal, the psychiatrist and CSW to discuss the patient and current plans. Conference call is scheduled for Thursday June 21, 2018 at 10am. She will call CSW back with a conference ID.  Johny Shears, MSW, Theresia Majors, Bridget Hartshorn Clinical Social Worker 06/19/2018 11:37 AM

## 2018-06-19 NOTE — Progress Notes (Signed)
Nursing 1:1 note D:Pt observed sitting in recliner interacting with staff. Respirations even and unlabored. No distress noted. Patient went to bathroom. A: 1:1 observation continues for safety  R: pt remains safe

## 2018-06-19 NOTE — Progress Notes (Signed)
Nursing 1:1 note D:Pt observed sitting in recliner interacting with staff. Respirations even and unlabored. No distress noted.  A: 1:1 observation continues for safety  R: pt remains safe

## 2018-06-19 NOTE — Progress Notes (Signed)
1:1 Hourly Note  2000- Patient sitting in hallway, talking to staff. Sitter at side. 2100- Compliant with HS medications. Sitting with sitter in hall talking. 2200- Sitting in hallway. Talking with 1:1 sitter.  2300- Patient is sleeping. Sitter at side. 0000- Patient is sleeping. Sitter at side. 0100- Patient is sleeping. Sitter at side. 0200- Patient is sleeping. Sitter at side. 0300- Patient is sleeping. Sitter at side.  0400- Patient sleeping. Sitter at side. 0500- Patient sleeping. Sitter at side. 0600- Compliant with VS. VSS. Sitter at side. 0700- Compliant with Xray. Sitter at side.

## 2018-06-19 NOTE — Progress Notes (Signed)
Patient at this time resting in the hallway with BHT and security 1:1 present for safety and security. Patient in no distress at this time, denies pain. Pt. Has reduced in agitations at this time. Will continue to monitor.
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
## 2018-06-19 NOTE — Progress Notes (Signed)
Nursing 1:1 note D:Pt in room in bathroom.  A: 1:1 observation continues for safety  R: pt remains safe

## 2018-06-19 NOTE — BHH Group Notes (Signed)
  LCSW Group Therapy Note  Tuesday Oct 22/19 at 1:00pm  Type of Therapy/Topic:  Group Therapy:  Feelings about Diagnosis  Participation Level:  Patient did not attend- resting in room  Description of Group:   This group will allow patients to explore their thoughts and feelings about diagnoses they have received. Patients will be guided to explore their level of understanding and acceptance of these diagnoses. Facilitator will encourage patients to process their thoughts and feelings about the reactions of others to their diagnosis and will guide patients in identifying ways to discuss their diagnosis with significant others in their lives. This group will be process-oriented, with patients participating in exploration of their own experiences, giving and receiving support, and processing challenge from other group members.   Therapeutic Goals: 1. Patient will demonstrate understanding of diagnosis as evidenced by identifying two or more symptoms of the disorder 2. Patient will be able to express two feelings regarding the diagnosis 3. Patient will demonstrate their ability to communicate their needs through discussion and/or role play  Summary of Patient Progress:  Therapeutic Modalities:   Cognitive Behavioral Therapy Brief Therapy Feelings Identification    Allsion Nogales LCSW [id number] 

## 2018-06-19 NOTE — Plan of Care (Addendum)
D: Pt denies SI/HI/AV hallucinations. Pt is pleasant and cooperative. Pt continues to complain of constipation and is receiving miralax daily. A: Pt was offered support and encouragement. Pt was given scheduled medications. Pt was encourage to control his behavior. Q 15 minute checks were done for safety.  R: Pt is taking medication. Pt has no complaints.Pt receptive to treatment and safety maintained on unit.    Problem: Education: Goal: Emotional status will improve Outcome: Progressing   Problem: Clinical Measurements: Goal: Will remain free from infection Outcome: Progressing   Problem: Nutrition: Goal: Adequate nutrition will be maintained Outcome: Progressing   Problem: Coping: Goal: Level of anxiety will decrease Outcome: Progressing   Problem: Safety: Goal: Ability to remain free from injury will improve Outcome: Progressing

## 2018-06-19 NOTE — Progress Notes (Signed)
Nursing 1:1 note D:Pt observed sitting in hall in recliner interacting with staff. RR even and unlabored. No distress noted. A: 1:1 observation continues for safety  R: pt remains safe

## 2018-06-19 NOTE — Progress Notes (Signed)
Patient at this time resting in the back hallway with 1:1 BHT and security guard present for safety and security. Pt. In no distress at this time.

## 2018-06-19 NOTE — Progress Notes (Signed)
Recreation Therapy Notes   Date: 06/19/2018  Time: 9:30 am   Location: Craft room   Behavioral response: N/A   Intervention Topic: Emotions  Discussion/Intervention: Patient did not attend group.   Clinical Observations/Feedback:  Patient did not attend group.   Ruchel Brandenburger LRT/CTRS        Christian Alexander 06/19/2018 10:43 AM 

## 2018-06-19 NOTE — BH Assessment (Signed)
Writer called and confirmed patient remains on [facility] Priority Wait List [phone]), per ([employer])

## 2018-06-19 NOTE — Progress Notes (Signed)
Nursing 1:1 note D:Pt observed sleeping in recliner with eyes closed. Respirations even and unlabored. No distress noted. A: 1:1 observation continues for safety  R: pt remains safe  

## 2018-06-20 MED ORDER — OXCARBAZEPINE 300 MG PO TABS
600.0000 mg | ORAL_TABLET | Freq: Two times a day (BID) | ORAL | Status: DC
  Administered 2018-06-21 (×2): 600 mg via ORAL
  Filled 2018-06-20 (×2): qty 2

## 2018-06-20 MED ORDER — CLOZAPINE 100 MG PO TABS
100.0000 mg | ORAL_TABLET | Freq: Every day | ORAL | Status: DC
  Administered 2018-06-20 – 2018-06-21 (×2): 100 mg via ORAL
  Filled 2018-06-20 (×2): qty 1
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
## 2018-06-20 NOTE — BHH Group Notes (Signed)
  Emotional Regulation October 23 1PM  Type of Therapy/Topic:  Group Therapy:  Emotion Regulation  Participation Level: Did not attend Description of Group:   The purpose of this group is to assist patients in learning to regulate negative emotions and experience positive emotions. Patients will be guided to discuss ways in which they have been vulnerable to their negative emotions. These vulnerabilities will be juxtaposed with experiences of positive emotions or situations, and patients will be challenged to use positive emotions to combat negative ones. Special emphasis will be placed on coping with negative emotions in conflict situations, and patients will process healthy conflict resolution skills.  Therapeutic Goals: 1. Patient will identify two positive emotions or experiences to reflect on in order to balance out negative emotions 2. Patient will label two or more emotions that they find the most difficult to experience 3. Patient will demonstrate positive conflict resolution skills through discussion and/or role plays  Summary of Patient Progress:       Therapeutic Modalities:   Cognitive Behavioral Therapy Feelings Identification Dialectical Behavioral Therapy   Christian Casto LCSW 2:15 pm   

## 2018-06-20 NOTE — Progress Notes (Signed)
8:00: Patient is alert in dayroom; behind lock doors with security and MHT. Patient is eating breakfast. No discomfort noted.

## 2018-06-20 NOTE — Plan of Care (Signed)
Pt. Denies SI/HI, verbally able to contract for safety. Pt. Non-accepting of provided education and shows no evidence of learning. Pt. Continues to have labile mood and is frequently irritable, agitated, and anxious. Pt. Interactions with staff poor and frequently hostile. Pt. Ability to demonstrate self-control poor.    Problem: Safety: Goal: Ability to remain free from injury will improve Outcome: Progressing   Problem: Education: Goal: Knowledge of [facility] Education information/materials will improve Outcome: Not Progressing Goal: Emotional status will improve Outcome: Not Progressing Goal: Mental status will improve Outcome: Not Progressing   Problem: Coping: Goal: Ability to interact with others will improve Outcome: Not Progressing   Problem: Safety: Goal: Ability to demonstrate self-control will improve Outcome: Not Progressing

## 2018-06-20 NOTE — Progress Notes (Signed)
Patient up at this time with 1:1 security guard and 1:1 BHT present for safety. Patient talking with BHT and security guard calm, no behavioral issues at this time. Will continue to monitor for safety.  

## 2018-06-20 NOTE — Plan of Care (Signed)
Patient able to remain safe while on the unit with interventions from staff. Pt. Continuous to be monitored with 1:1 security and BHT for safety. Pt. Denies SI/Hi, verbally able to contract for safety this evening, despite frequent incidents of inappropriate behaviors. Pt. Provided with patient education, but continues to be non-accepting of this. Pt. Continues to present with frequent incidents of severe agitations, anxieties, and irritability, requiring PRN medications to maintain control. Pt. Continues to be unable to utilize coping skills to maintain and control behaviors. Pt. Interactions with staff frequently hostile and inappropriate. Pt. Continues to be unable to utilize self control.    Problem: Education: Goal: Knowledge of [facility] Education information/materials will improve Outcome: Not Progressing Goal: Emotional status will improve Outcome: Not Progressing Goal: Mental status will improve Outcome: Not Progressing   Problem: Coping: Goal: Ability to interact with others will improve Outcome: Not Progressing   Problem: Safety: Goal: Ability to demonstrate self-control will improve Outcome: Not Progressing   Problem: Safety: Goal: Ability to remain free from injury will improve Outcome: Progressing

## 2018-06-20 NOTE — Progress Notes (Signed)
Patient at this time sleeping in his room with 1:1 BHT and security present for safety and security. Pt. Presents with no visible distress with even and unlabored respirations. Will continue to monitor for safety.  

## 2018-06-20 NOTE — Progress Notes (Signed)
Patient at this time noticeably much calmer in presentation and easier to redirect. Pt. Still slightly elevated, but significantly reduced agitations and responding to internal stimuli. Patient is observed with 1:1 BHT and 1:1 security guard for safety. Pt. Denies pain.

## 2018-06-20 NOTE — Progress Notes (Signed)
Patient at this time sleeping in his room with 1:1 BHT and security present for safety and security. Pt. Presents with no visible distress with even and unlabored respirations. Will continue to monitor for safety.

## 2018-06-20 NOTE — Progress Notes (Signed)
11 am: Patient now out of restraints; sitting in hallway talking with staff; no distress noted.  12:00 pm: Patient eating lunch; Patient talking with staff.Patient threatening to become violent by telling staff if he doesn't leave by tomorrow he will continue to "flip out" until he does. Patient continues to be monitored with security and MHT.
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
## 2018-06-20 NOTE — Tx Team (Signed)
Interdisciplinary Treatment and Diagnostic Plan Update  06/20/2018 Time of Session: 8:30am Christian Alexander MRN: [id number]  Principal Diagnosis: Bipolar I disorder, most recent episode depressed, severe without psychotic features (HCC)  Secondary Diagnoses: Principal Problem:   Bipolar I disorder, most recent episode depressed, severe without psychotic features (HCC) Active Problems:   Suicidal ideation   Status post aortic valve replacement using Ross procedure   Gastric foreign body   Foreign body in alimentary tract   Current Medications:  Current Facility-Administered Medications  Medication Dose Route Frequency Provider Last Rate Last Dose  . 0.9 %  sodium chloride infusion   Intravenous Continuous Melodie Bouillon B, MD 20 mL/hr at 06/17/18 1120    . acetaminophen (TYLENOL) tablet 650 mg  650 mg Oral Q6H PRN Clapacs, Jackquline Denmark, MD   650 mg at 06/10/18 2256  . ALPRAZolam (XANAX) tablet 1 mg  1 mg Oral BID Pucilowska, Jolanta B, MD   1 mg at 06/20/18 0844  . alum & mag hydroxide-simeth (MAALOX/MYLANTA) 200-200-20 MG/5ML suspension 30 mL  30 mL Oral Q4H PRN Clapacs, John T, MD      . chlorproMAZINE (THORAZINE) injection 50 mg  50 mg Intramuscular TID PRN Pucilowska, Jolanta B, MD   50 mg at 06/17/18 1523  . cloZAPine (CLOZARIL) tablet 50 mg  50 mg Oral QHS Pucilowska, Jolanta B, MD   50 mg at 06/19/18 2105  . LORazepam (ATIVAN) injection 2 mg  2 mg Intramuscular Q8H PRN Beverly Sessions, MD   2 mg at 06/18/18 4098  . LORazepam (ATIVAN) tablet 2 mg  2 mg Oral Q6H PRN Pucilowska, Jolanta B, MD   2 mg at 06/20/18 0333  . magnesium hydroxide (MILK OF MAGNESIA) suspension 30 mL  30 mL Oral Daily PRN Clapacs, Jackquline Denmark, MD   30 mL at 06/16/18 2045  . nicotine polacrilex (NICORETTE) gum 2 mg  2 mg Oral PRN Pucilowska, Jolanta B, MD   2 mg at 06/15/18 1441  . NON FORMULARY 1 Inhaler  1 Inhaler Oral Daily PRN Pucilowska, Jolanta B, MD      . Oxcarbazepine (TRILEPTAL) tablet 300 mg  300 mg Oral  BID Pucilowska, Jolanta B, MD   300 mg at 06/20/18 0844  . polyethylene glycol (MIRALAX / GLYCOLAX) packet 17 g  17 g Oral Daily Pucilowska, Jolanta B, MD   17 g at 06/19/18 0802  . tamsulosin (FLOMAX) capsule 0.4 mg  0.4 mg Oral QPC breakfast Pucilowska, Jolanta B, MD   0.4 mg at 06/20/18 0844  . temazepam (RESTORIL) capsule 30 mg  30 mg Oral QHS PRN Pucilowska, Jolanta B, MD   30 mg at 06/19/18 2105  . ziprasidone (GEODON) injection 20 mg  20 mg Intramuscular BID PRN Pucilowska, Jolanta B, MD   20 mg at 06/20/18 0900   PTA Medications: Medications Prior to Admission  Medication Sig Dispense Refill Last Dose  . naltrexone (DEPADE) 50 MG tablet Take 50 mg by mouth daily.  0   . Oxcarbazepine (TRILEPTAL) 300 MG tablet Take 300-900 mg by mouth See admin instructions. Take 1 tablet (300MG ) by mouth every morning and lunchtime and take 3 tablets (900MG ) by mouth every night at bedtime  0   . QUEtiapine (SEROQUEL) 100 MG tablet Take 50-200 mg by mouth See admin instructions. Take  tablet (50MG ) by mouth every morning and 2 tablets (200MG ) by mouth every night at bedtime  0   . tamsulosin (FLOMAX) 0.4 MG CAPS capsule Take 0.4 mg by mouth.  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   Physician Treatment Plan for Primary Diagnosis: Bipolar I disorder, most recent episode depressed, severe without psychotic features (HCC) Long Term Goal(s): Improvement in symptoms so as ready for discharge Improvement in symptoms so as ready for discharge   Short Term Goals: Ability to identify changes in lifestyle to reduce recurrence of condition will improve Ability to verbalize feelings will improve Ability to disclose and discuss suicidal ideas Ability to  demonstrate self-control will improve Ability to identify and develop effective coping behaviors will improve Ability to maintain clinical measurements within normal limits will improve Compliance with prescribed medications will improve Ability to identify triggers associated with substance abuse/mental health issues will improve NA  Medication Management: Evaluate patient's response, side effects, and tolerance of medication regimen.  Therapeutic Interventions: 1 to 1 sessions, Unit Group sessions and Medication administration.  Evaluation of Outcomes: Not Progressing  Physician Treatment Plan for Secondary Diagnosis: Principal Problem:   Bipolar I disorder, most recent episode depressed, severe without psychotic features (HCC) Active Problems:   Suicidal ideation   Status post aortic valve replacement using Ross procedure   Gastric foreign body   Foreign body in alimentary tract  Long Term Goal(s): Improvement in symptoms so as ready for discharge Improvement in symptoms so as ready for discharge   Short Term Goals: Ability to identify changes in lifestyle to reduce recurrence of condition will improve Ability to verbalize feelings will improve Ability to disclose and discuss suicidal ideas Ability to demonstrate self-control will improve Ability to identify and develop effective coping behaviors will improve Ability to maintain clinical measurements within normal limits will improve Compliance with prescribed medications will improve Ability to identify triggers associated with substance abuse/mental health issues will improve NA     Medication Management: Evaluate patient's response, side effects, and tolerance of medication regimen.  Therapeutic Interventions: 1 to 1 sessions, Unit Group sessions and Medication administration.  Evaluation of Outcomes: Not Progressing   RN Treatment Plan for Primary Diagnosis: Bipolar I disorder, most recent episode depressed, severe  without psychotic features (HCC) Long Term Goal(s): Knowledge of disease and therapeutic regimen to maintain health will improve  Short Term Goals: Ability to verbalize feelings will improve, Ability to identify and develop effective coping behaviors will improve and Compliance with prescribed medications will improve  Medication Management: RN will administer medications as ordered by provider, will assess and evaluate patient's response and provide education to patient for prescribed medication. RN will report any adverse and/or side effects to prescribing provider.  Therapeutic Interventions: 1 on 1 counseling sessions, Psychoeducation, Medication administration, Evaluate responses to treatment, Monitor vital signs and CBGs as ordered, Perform/monitor CIWA, COWS, AIMS and Fall Risk screenings as ordered, Perform wound care treatments as ordered.  Evaluation of Outcomes: Not Progressing   LCSW Treatment Plan for Primary Diagnosis: Bipolar I disorder, most recent episode depressed, severe without psychotic features (HCC) Long Term Goal(s): Safe transition to appropriate next level of care at discharge, Engage patient in therapeutic group addressing interpersonal concerns.  Short Term Goals: Engage patient in aftercare planning with referrals and resources, Increase social support, Increase emotional regulation, Facilitate acceptance of mental health diagnosis and concerns, Identify triggers associated with mental health/substance abuse issues and Increase skills for wellness and recovery  Therapeutic Interventions: Assess for all discharge  needs, 1 to 1 time with Child psychotherapist, Explore available resources and support systems, Assess for adequacy in community support network, Educate family and significant other(s) on suicide prevention, Complete Psychosocial Assessment, Interpersonal group therapy.  Evaluation of Outcomes: Not Progressing   Progress in Treatment: Attending groups:  No. Participating in groups: No. Taking medication as prescribed: Yes. Toleration medication: Yes. Family/Significant other contact made: Yes, individual(s) contacted:  spoke with guardian Hilbert Odor Patient understands diagnosis: No. Discussing patient identified problems/goals with staff: No. Medical problems stabilized or resolved: Yes. Denies suicidal/homicidal ideation: No. Issues/concerns per patient self-inventory: No. Other:   New problem(s) identified: Yes, Describe:  Agression, self-harm  New Short Term/Long Term Goal(s): Patient did not attend treatment team.  Patient Goals:  Patient did not attend treatment team.  Discharge Plan or Barriers: [facility] has ordered the patient to be admitted into [facility]. Treatment team members are waiting to hear on the transfer plan.    Reason for Continuation of Hospitalization: Aggression Medication stabilization  Estimated Length of Stay: 3-5 days   Recreational Therapy: Patient Stressors: N/A Patient Goal: Patient will engage in groups with a calm and appropriate mood at least 2x within 5 recreation therapy group sessions  Attendees: Patient:  06/20/2018 10:15 AM  Physician: Dr. Jennet Maduro, MD 06/20/2018 10:15 AM  Nursing: Milas Hock, RN 06/20/2018 10:15 AM  RN Care Manager: 06/20/2018 10:15 AM  Social Worker: Johny Shears, LCSWA 06/20/2018 10:15 AM  Recreational Therapist:  06/20/2018 10:15 AM  Other: Lowella Dandy, LCSW 06/20/2018 10:15 AM  Other: Unk Pinto, RHA, Claudine [place], LCSW 06/20/2018 10:15 AM  Other: Corinna Gab, MD 06/20/2018 10:15 AM    Scribe for Treatment Team: Johny Shears, LCSW 06/20/2018 10:15 AM

## 2018-06-20 NOTE — Progress Notes (Signed)
Patient up at this time with 1:1 security guard and 1:1 BHT present for safety. Patient talking with BHT and security guard calm, no behavioral issues at this time. Will continue to monitor for safety.

## 2018-06-20 NOTE — Progress Notes (Signed)
Patient up at this time with 1:1 security guard and 1:1 BHT present for safety. Patient going through his journal at this time coloring. Will continue to monitor for safety.

## 2018-06-20 NOTE — Progress Notes (Signed)
Patient at this time observed in his room with 1:1 BHT and security guard present for safety. Patient is calm and cooperative, resting in room. Will continue to monitor.
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
## 2018-06-20 NOTE — Progress Notes (Signed)
Patient irritated turning lights off in the hallway, called nurse "Bitch" due to lights being turned back on in the hallway. Nurse educated patient on the reason why lights needed to be on for visualization of patients on the unit. Patient states," What the fuck does that have to do with me?", and then proceeded to grin. Patient asked nurse "What are they trying to do with me.?" Patient was reminded of earlier conversation with physician about placement in a higher level of care facility. Patient asked, "what are they waiting on? RN replied, "A BED." Patient asked for a writing pen. Patient states,"Im not going to do anything stupid with it." Patient   Patient told he could use crayons due to previous behaviors of sticking foreign objects in his penis. Patient yells out, "I'm Bored."  Patient has MHT and security with him for safety.

## 2018-06-20 NOTE — Progress Notes (Signed)
Patient received soap and towel washcloth; paper scrubs and was able to shower. Patient exhibits no signs of discomfort at this time. MHT and security present.

## 2018-06-20 NOTE — Progress Notes (Signed)
Patient at this timesleepingin the back hallway with 1:1 BHT presentand security guardavailablefor safety and security. Pt. In no distress at this time.Pt. Respirations even and unlabored. No signs of distress at this time. Will continue to monitor for safety and security.

## 2018-06-20 NOTE — Progress Notes (Signed)
Patient up at this time with 1:1 security guard and 1:1 BHT present for safety. Patient going through his journal at this time reading. Will continue to monitor for safety.

## 2018-06-20 NOTE — Progress Notes (Signed)
Patient at this timesleepingin the back hallway with 1:1 BHT presentand security guardavailablefor safety and security. Pt. In no distress at this time.Pt. Respirations even and unlabored. No signs of distress at this time. Will continue to monitor for safety and security. 

## 2018-06-20 NOTE — BH Assessment (Signed)
Writer faxed updated information about patient's behavior to [facility] ([phone]). Information Faxed: All BH progress notes time stamped 06/18/2018 5:02pm - 06/20/2018 10:08am. Confirmed faxed was received, pending review and pt remains on Priority Wait List.  Chrissie Noa [facility] Intake RN) confirmed fax successfully received.   He also confirmed that he received the IVC - Court Order documentation.  Will continue to fax BH progress notes in the meantime.
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
## 2018-06-20 NOTE — Progress Notes (Addendum)
[facility] MD Progress Note  06/20/2018 5:32 PM Christian Alexander  MRN:  [id number]  Subjective:    We were hoping to transfer Christian Alexander to [facility] today but a bed is not available. Christian Alexander has been more agitated today. He went into restraints this morning after an argument with his guardian who suggested that the patient asks his treatment team to discontinue IM Geodon as he was "getting high" from it. Throughout the day he had multiple demands and complaints. He wanted to talk to me several times. Each time getting uset with my ansvers requiring PRN. I am not sure why the patient has constant access to a phone. We will limit his phone privileges. He demands tp be transferred to [facility] psychiatry as they know him well and give him better medications. He could not name any such medications but made several phone calls to psychiatry there.  I called off his appointment with [facility] congenital MD.  All day were in contact with [facility] aabout transfer.     Principal Problem: Bipolar I disorder, most recent episode depressed, severe without psychotic features (HCC) Diagnosis:   Patient Active Problem List   Diagnosis Date Noted  . Bipolar I disorder, most recent episode depressed, severe without psychotic features (HCC) [F31.4] 06/08/2018    Priority: High  . Gastric foreign body [T18.2XXA]   . Foreign body in alimentary tract [T18.9XXA]   . 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 Bipolar 1 disorder, depressed, moderate (HCC) [F31.32] 08/15/2014  . 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 ODD (oppositional defiant disorder) [F91.3] 08/15/2014  . 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 Homicidal ideation [R45.850]   . Suicidal ideation [R45.851]    Total Time spent with patient: 45 minutes  Past Psychiatric History: bipolar disorder.  Past  Medical History:  Past Medical History:  Diagnosis Date  . Aortic valve stenosis   . Attention deficit disorder (ADD)   . ODD (oppositional defiant disorder)     Past Surgical History:  Procedure Laterality Date  . CARDIAC SURGERY    . ESOPHAGOGASTRODUODENOSCOPY N/A 06/17/2018   Procedure: ESOPHAGOGASTRODUODENOSCOPY (EGD);  Surgeon: Pasty Spillers, MD;  Location: [facility] ENDOSCOPY;  Service: Endoscopy;  Laterality: N/A;   Family History: History reviewed. No pertinent family history. Family Psychiatric  History: adopted Social History:  Social History   Substance and Sexual Activity  Alcohol Use No     Social History   Substance and Sexual Activity  Drug Use Yes  . Types: Marijuana    Social History   Socioeconomic History  . Marital status: Single    Spouse name: Not on file  . Number of children: Not on file  . Years of education: Not on file  . Highest education level: Not on file  Occupational History  . Not on file  Social Needs  . Financial resource strain: Not on file  . Food insecurity:    Worry: Not on file    Inability: Not on file  . Transportation needs:    Medical: Not on file    Non-medical: Not on file  Tobacco Use  . Smoking status: Current Some Day Smoker    Packs/day: 1.00    Types: Cigarettes  . Smokeless tobacco: Never Used  Substance and Sexual Activity  . Alcohol use: No  . Drug use: Yes    Types: Marijuana  . Sexual activity: Never  Lifestyle  . Physical activity:    Days per week: Not on file    Minutes per session: Not on file  . Stress: Not on file  Relationships  . Social connections:    Talks on phone: Not on file    Gets together: Not on file    Attends religious service: Not on file    Active member of club or organization: Not on file    Attends meetings of clubs or organizations: Not on file    Relationship status: Not on file  Other Topics Concern  . Not on file  Social History Narrative  . Not on file    Additional Social History:                         Sleep: Fair  Appetite:  Fair  Current Medications: Current Facility-Administered Medications  Medication Dose Route Frequency Provider Last Rate Last Dose  . 0.9 %  sodium chloride infusion   Intravenous Continuous Melodie Bouillon B, MD 20 mL/hr at 06/17/18 1120    . acetaminophen (TYLENOL) tablet 650 mg  650 mg Oral Q6H PRN Clapacs, Jackquline Denmark, MD   650 mg at 06/10/18 2256  . ALPRAZolam (XANAX) tablet 1 mg  1 mg Oral BID Marca Gadsby B, MD   1 mg at 06/20/18 0844  . alum & mag hydroxide-simeth (MAALOX/MYLANTA) 200-200-20 MG/5ML suspension 30 mL  30 mL Oral Q4H PRN Clapacs, John T, MD      . chlorproMAZINE (THORAZINE) injection 50 mg  50 mg Intramuscular TID PRN Amelia Burgard B, MD   50 mg at 06/17/18 1523  . cloZAPine (CLOZARIL) tablet 50 mg  50 mg Oral QHS Yolander Goodie B, MD   50 mg at 06/19/18 2105  . LORazepam (ATIVAN) injection 2 mg  2 mg Intramuscular Q8H PRN Beverly Sessions, MD   2 mg at 06/18/18 1610  . LORazepam (ATIVAN) tablet 2 mg  2 mg Oral Q6H PRN Stormy Connon B, MD   2 mg at 06/20/18 1441  . magnesium hydroxide (MILK OF MAGNESIA) suspension 30 mL  30 mL Oral Daily PRN Clapacs, Jackquline Denmark, MD   30 mL at 06/16/18 2045  . nicotine polacrilex (NICORETTE) gum 2 mg  2 mg Oral PRN Jennalyn Cawley B, MD   2 mg at 06/15/18 1441  . NON FORMULARY 1 Inhaler  1 Inhaler Oral Daily PRN Inioluwa Baris B, MD      . Oxcarbazepine (TRILEPTAL) tablet 300 mg  300 mg Oral BID Jahziah Simonin B, MD   300 mg at 06/20/18 1711  . polyethylene glycol (MIRALAX / GLYCOLAX) packet 17 g  17 g Oral Daily Favio Moder B, MD   17 g at 06/19/18 0802  . tamsulosin (FLOMAX) capsule 0.4 mg  0.4 mg Oral QPC breakfast Roye Gustafson B, MD   0.4 mg at 06/20/18 0844  . temazepam (RESTORIL) capsule 30 mg  30 mg Oral QHS PRN Nyela Cortinas B, MD   30 mg at 06/19/18 2105  . ziprasidone (GEODON) injection  20 mg  20 mg Intramuscular BID PRN Sye Schroepfer B, MD   20 mg at 06/20/18 0900    Lab Results: No results found for this or any previous visit (from the past 48 hour(s)).  Blood Alcohol level:  Lab Results  Component Value Date   [facility] <10 06/06/2018   ETH <5 12/13/2015    Metabolic Disorder Labs: Lab Results  Component Value Date  HGBA1C 4.5 (L) 06/09/2018   MPG 82.45 06/09/2018   MPG 100 08/16/2014   No results found for: PROLACTIN Lab Results  Component Value Date   CHOL 140 06/09/2018   TRIG 50 06/09/2018   HDL 59 06/09/2018   CHOLHDL 2.4 06/09/2018   VLDL 10 06/09/2018   LDLCALC 71 06/09/2018   LDLCALC 57 08/16/2014    Physical Findings: AIMS:  , ,  ,  ,    CIWA:    COWS:     Musculoskeletal: Strength & Muscle Tone: within normal limits Gait & Station: normal Patient leans: N/A  Psychiatric Specialty Exam: Physical Exam  Nursing note and vitals reviewed. Psychiatric: His speech is normal. His affect is angry, labile and inappropriate. He is agitated, aggressive and hyperactive. Thought content is paranoid and delusional. Cognition and memory are normal. He expresses impulsivity.    Review of Systems  Neurological: Negative.   Psychiatric/Behavioral: Negative.   All other systems reviewed and are negative.   Blood pressure 109/70, pulse (!) 116, temperature 98 F (36.7 C), temperature source Oral, resp. rate 16, height 5' 2.01" (1.575 m), weight 49 kg, SpO2 100 %.Body mass index is 19.75 kg/m.  General Appearance: Casual  Eye Contact:  Good  Speech:  Clear and Coherent  Volume:  Increased  Mood:  Angry, Dysphoric and Irritable  Affect:  Inappropriate and Labile  Thought Process:  Irrelevant  Orientation:  Full (Time, Place, and Person)  Thought Content:  Delusions and Paranoid Ideation  Suicidal Thoughts:  No  Homicidal Thoughts:  No  Memory:  Immediate;   Fair Recent;   Fair Remote;   Fair  Judgement:  Poor  Insight:  Lacking   Psychomotor Activity:  Increased  Concentration:  Concentration: Poor and Attention Span: Poor  Recall:  Poor  Fund of Knowledge:  Poor  Language:  Poor  Akathisia:  No  Handed:  Right  AIMS (if indicated):     Assets:  Communication Skills Desire for Improvement Financial Resources/Insurance Housing Physical Health Resilience  ADL's:  Intact  Cognition:  WNL  Sleep:  Number of Hours: 5     Treatment Plan Summary: Daily contact with patient to assess and evaluate symptoms and progress in treatment and Medication management   Christian Alexander is a 17 year old male with a history of mood instability and self injurious behavior admitted for repeated suicide attempts while in jail. currently 1:1sitter andsecuritystaff, and receives PRNs daily. Pt has the guardian.  #Agitation, improving -1:1 sitterPLUSsecurity guard -oral Haldol, Ativan available -Geodon 20 mg IM BID PRN -Thorazine 50 mg IM PRN -pharmacy received Adasuve, loxapine inhaler. This can be used once daily for agitation with caution as it may cause bronchospasm  #Mood/psychosis, not improving -Clozapine titration 10 mg tonight 06/20/2018, refused labs today -continueXanax 1 mg BID -increase Trileptalto 600mg  BID  #Foreign body in the gut and rectum, resolved -surgery and gastroenterology input is appreciated -follow up Xray is negative today 10/22  #Constipation -Miralax PRN  #Insomnia, slept8 hours -Restoril 30 mg PRN insomnia  #Status post Ross procedure -Cardiac ECHO completed on 06/11/2018 -tachycardia 118 today  #Urethra foreign bodyinsertion, resolved -cystoscopy did not reveal foreign body -urology input is greatly appreciated -self cath for urinary retention -no indication for Flomax but patient claims it helps  #Smoking cessation -nicorette gum PRN  #Labs -lipid panel, TSH, A1C are normal  #Social -incompetent adult -[place]  #Disposition -awaiting bed at [facility], made priority list, -he will return to his  group homeif he improves  Kristine Linea, MD 06/20/2018, 5:32 PM

## 2018-06-20 NOTE — Progress Notes (Signed)
10:00 Patient in restraint chair, continues to be irritable in chair. Patient re-educated on appropriate behaviors on the unit and appropriate behaviors needed in order to be released from restraint chair. Patient verbalized understanding. No injury noted, respiration is even and unlabored, skin checked; circulation normal; Urinal offered with patient refusal; water offered. Patient continues to have 1:1 monitoring with MHT and security.

## 2018-06-20 NOTE — Progress Notes (Signed)
Patient in room, screaming ; agitated; spitting at staff. Patient just spoke with guardian on the telephone and became agitated about conversation. Patient tried to bite staff therefore manual hold placed. Patient continued to escalate with continued spitting and trying to kick, and attempting to bang head; patient placed in the restraint chair to protect patient from self harm. Patient received Geodon to manage aggression. Nurse will continue to monitor.

## 2018-06-20 NOTE — Progress Notes (Signed)
Recreation Therapy Notes  Date: 06/20/2018  Time: 9:30 am   Location: Craft room   Behavioral response: N/A   Intervention Topic: Communication  Discussion/Intervention: Patient did not attend group.   Clinical Observations/Feedback:  Patient did not attend group.   Sofi Bryars LRT/CTRS        Christian Alexander 06/20/2018 12:09 PM

## 2018-06-20 NOTE — Progress Notes (Signed)
2 PM: patient walking the back hallway, pacing and talking with staff. Patient expressed wanting to leave the hospital and continues to make verbal threats stating, "I have something for second shift tonight." Patient continues to have brief outburst of yelling, patient received ativan to help manage anxiety.   3 PM: Patient continues to sitting in hallway in chair, continues to yell out intermittently about leaving the hospital and demanding to use telephone. Patient has been educated on telephone hours and the use of the telephone. Nurse will continue to monitor.  4 PM : Patient sitting in the hallway; talking loudly, irritable about not getting to leave the hospital at the particular moment.  Nurse will continue to monitor. Security present as well.

## 2018-06-20 NOTE — Progress Notes (Signed)
D: Upon arrival to the unit the patient is observed on the back hallway visibly agitated at posturing towards BHT completing safety rounds. Patient is also slapping at the back hallway door attempting to provoke staff to engage with him while he is verbally and physically aggressive at this time. Patient at this time is engaged by this Clinical research associate and is able to be redirected with therapeutic communication and PRN medications. Pt. Is complaint with PRN medications and able to eventually reduce agitations and comply with assessments and unit procedures.   Pt. During assessments is more in control of his behavior and when asked about psychiatric assessment questions denies all. Pt. Verbally was able to contract for safety and denies SI/HI/AVH. Pt. Denies during assessments anxiety and depression. Pt. Mood labile.     A: Patient continuously monitored with 1:1 BHT and security on the unit for safety. Patient was provided with education, but is non-accepting of this.  Patient was given/offered medications per orders. Pt. Chart and plans of care reviewed. Pt. Given support and encouragement.   R: Patient is complaint with medication and unit procedures with direction and encouragement from staff. Pt. Continues to present unpredictable and frequently irritable and agitated requiring prn medications to maintain control as to not self harm or harm others. Pt. Frequently reports anxiety despite denying during assessments. Pt. Reports anxiety about discharging.             Precautionary checks for safety maintained, room free of safety hazards, patient sustains no injury or falls during this shift. Will endorse care to next shift.

## 2018-06-20 NOTE — Progress Notes (Signed)
Patient at this time reports to this writer he is anxious about discharge and would like PRN medications to maintain control. Pt. Given PRN medications to aid the patient in maintaining control. Will continue to monitor for safety.

## 2018-06-20 NOTE — Progress Notes (Signed)
Patient at this timesleepingin the back hallway with 1:1 BHT present with security guardavailablefor safety and security. Pt. In no distress at this time.Pt. Respirations even and unlabored. No signs of distress at this time. Will continue to monitor for safety and security.

## 2018-06-21 LAB — CBC WITH DIFFERENTIAL/PLATELET
ABS IMMATURE GRANULOCYTES: 0.16 10*3/uL — AB (ref 0.00–0.07)
BASOS PCT: 1 %
Basophils Absolute: 0.1 10*3/uL (ref 0.0–0.1)
EOS ABS: 0.2 10*3/uL (ref 0.0–0.5)
Eosinophils Relative: 1 %
HEMATOCRIT: 41.5 % (ref 39.0–52.0)
Hemoglobin: 14.4 g/dL (ref 13.0–17.0)
IMMATURE GRANULOCYTES: 1 %
Lymphocytes Relative: 21 %
Lymphs Abs: 2.5 10*3/uL (ref 0.7–4.0)
MCH: 29.3 pg (ref 26.0–34.0)
MCHC: 34.7 g/dL (ref 30.0–36.0)
MCV: 84.5 fL (ref 80.0–100.0)
MONOS PCT: 10 %
Monocytes Absolute: 1.3 10*3/uL — ABNORMAL HIGH (ref 0.1–1.0)
NEUTROS ABS: 7.9 10*3/uL — AB (ref 1.7–7.7)
NEUTROS PCT: 66 %
Platelets: 286 10*3/uL (ref 150–400)
RBC: 4.91 MIL/uL (ref 4.22–5.81)
RDW: 12.9 % (ref 11.5–15.5)
WBC: 12.1 10*3/uL — ABNORMAL HIGH (ref 4.0–10.5)
nRBC: 0 % (ref 0.0–0.2)

## 2018-06-21 MED ORDER — CLOZAPINE 100 MG PO TABS: 100.0000 mg | ORAL_TABLET | Freq: Every day | ORAL | 1 refills | Status: AC

## 2018-06-21 MED ORDER — OXCARBAZEPINE 600 MG PO TABS: 600.0000 mg | ORAL_TABLET | Freq: Two times a day (BID) | ORAL | 1 refills | Status: AC

## 2018-06-21 NOTE — Progress Notes (Signed)
Patient at this time sleeping in his room 1:1 BHT and security present for safety and security. Patient presents with no visible distress with even and unlabored respirations. Will continue to monitor for safety.  

## 2018-06-21 NOTE — Progress Notes (Signed)
Patient at this time sleeping in his room with 1:1 BHT and security present for safety and security. Patient presents with no visible distress with even and unlabored respirations. Will continue to monitor for safety.  

## 2018-06-21 NOTE — Progress Notes (Signed)
10 am: Patient in hallway talking with staff, patient given toiletries to shower; patient clothing placed in washer/dryer. Patient informed if behavior is appropriate today, patient will be able to go outside with security and MHT during outdoor time. Patient responded positively to information.  11 am : Patient eating lunch, no signs of distress; MHT and security present.

## 2018-06-21 NOTE — BHH Group Notes (Signed)
[facility] Group Notes:  (Nursing/MHT/Case Management/Adjunct)  Date:  06/21/2018  Time:  5:24 AM  Type of Therapy:  Group Therapy  Participation Level:  Did Not Attend  Summary of Progress/Problems:  Lelan Pons 06/21/2018, 5:24 AM

## 2018-06-21 NOTE — Progress Notes (Signed)
Patient at this time sleeping in his room with 1:1 BHT and security present for safety and security. Patient presents with no visible distress with even and unlabored respirations. Will continue to monitor for safety.

## 2018-06-21 NOTE — Progress Notes (Signed)
Recreation Therapy Notes  Date: 06/21/2018  Time: 9:30 am   Location: Craft room   Behavioral response: N/A   Intervention Topic: Problem Solving  Discussion/Intervention: Patient did not attend group.   Clinical Observations/Feedback:  Patient did not attend group.   Kawanna Christley LRT/CTRS        Ailana Cuadrado 06/21/2018 11:09 AM

## 2018-06-21 NOTE — BHH Counselor (Signed)
Phone conference was completed with CSW Investment banker, operational and Darren), DR. Pucilowska, Hilbert Odor, the patients guardian and Noreene Larsson from [employer] to discuss the patients current status regarding his discharge plan. Currently, the team is waiting to hear back from the [place] on an order for the patient to be admitted into [facility]. Noreene Larsson reported that she spoke with the admissions coordinator with [facility] and they do not have any available beds and that they are waiting on the patients legal order to be completed. Dr. Jennet Maduro reports that if the patientimproves without any behavioral issues, she will discharge him back to his group home. Noreene Larsson and Rebecka Apley report that the patients 60 day notice from his current group home will expire on July 17, 2018. They are looking for placement for the patient. They also report doing a referral to the [facility] with [facility] [place]. The patient has been previously referred but was denied. They will complete a new referral with new information on the patients status. Treatment team will continue to coordinate treatment and discharge plans for the patient.  Johny Shears, MSW, Theresia Majors, Bridget Hartshorn Clinical Social Worker 06/21/2018 11:04 AM

## 2018-06-21 NOTE — Progress Notes (Signed)
7 am: Patient asleep in bed; eyes closed; respirations even and unlabored; MHT and security within arms reach. No sign of distress.  8 AM: Patient sitting in dayroom; eating breakfast, medications passed; no distress noted, MHT and Security within arms reach.   9 AM: Patient sitting in hallway talking with MHT and security, No distress noted.

## 2018-06-21 NOTE — Progress Notes (Signed)
12 pm: Patient sitting in chair in dayroom, coloring, no distress noted.  1 PM: Patient sitting in chair in the dayroom ,talking with security an MHT no distress noted.  2 PM: Patient in hallway talking with MHT and security. No distress noted.  3 PM: Patient outside with MHT and security playing basketball interaction with peers appropriately. No distress noted.  4 PM: Patient eating dinner in dayroom with other patients, no distress noted.  5PM: Patient in dayroom on back hall with MHT and security. Talking no discomfort noted.  6PM : Patient banging on the door, Patient asked for something else to do, Nurse gave options of coloring, watching, television, reading or nap. Patient irritable about choices stating : I am ready to go to [facility].  Patient continued to talk to MHT and security.  7pm : Patient is in day room on back hall watching television, no distress noted.

## 2018-06-21 NOTE — Progress Notes (Signed)
Patient at this time sleeping in his room 1:1 BHT and security present for safety and security. Patient presents with no visible distress with even and unlabored respirations. Will continue to monitor for safety.

## 2018-06-21 NOTE — BH Assessment (Signed)
Updated IVC/Court Order faxed to [facility] ([place]-[phone]) and confirmed it was received.

## 2018-06-21 NOTE — BHH Group Notes (Signed)
LCSW Group Therapy Note  06/21/2018 1:00 pm  Type of Therapy/Topic:  Group Therapy:  Balance in Life  Participation Level:  Did Not Attend  Description of Group:    This group will address the concept of balance and how it feels and looks when one is unbalanced. Patients will be encouraged to process areas in their lives that are out of balance and identify reasons for remaining unbalanced. Facilitators will guide patients in utilizing problem-solving interventions to address and correct the stressor making their life unbalanced. Understanding and applying boundaries will be explored and addressed for obtaining and maintaining a balanced life. Patients will be encouraged to explore ways to assertively make their unbalanced needs known to significant others in their lives, using other group members and facilitator for support and feedback.  Therapeutic Goals: 1. Patient will identify two or more emotions or situations they have that consume much of in their lives. 2. Patient will identify signs/triggers that life has become out of balance:  3. Patient will identify two ways to set boundaries in order to achieve balance in their lives:  4. Patient will demonstrate ability to communicate their needs through discussion and/or role plays  Summary of Patient Progress:      Therapeutic Modalities:   Cognitive Behavioral Therapy Solution-Focused Therapy Assertiveness Training  Nance Mccombs S Johntae, LCSW 06/21/2018 4:17 PM  

## 2018-06-21 NOTE — BH Assessment (Signed)
Patient has been accepted to [facility].  Was told by the [facility] Admission Nurse, use her name for the accepting physician. Call report to [phone].  Representative was [employer].   Writer informed [facility] BMU  Jon Gills, Charge Nurse Dr. Jennet Maduro, Attending Physician Harriett Sine, Unit Secretary   Writer informed patient's guardian ([phone]) have been updated as well.

## 2018-06-21 NOTE — Progress Notes (Signed)
Patient discharged for transport to [facility]. Report given to [facility] [phone]. Reports given to transporting officers.

## 2018-06-21 NOTE — BH Assessment (Addendum)
12:45-Writer received phone call from [facility] ([phone]) requesting an "New Exception."  Writer completed the documentation required to submit the Exception and faxed the information to [employer] and other supporting documents for the Approval/Tracking Number.  15:56-Called and spoke with [employer]([place]-[phone]) and confirmed it was received pending review. "We have it, it's in the cue waiting to for it to get assigned to someone."  16:01-Writer called and spoke with [facility] [phone]) to update them about the status of the "Exception."   16:50-Writer followed up with [employer] (Angela-[phone]) about the status of the "Exception." It's still "pending in the cue to be assigned to a clinician for review." States it should be reviewed "with an a hour or so."  18.32Scientific laboratory technician followed up with [employer] ([place]-[phone]) and she shared it was reviewed by another co-worker Biomedical scientist) but he had a crisis call to and he didn't get a chance to send it. Thus, she was able to send the information to via faxed to [facility] and to [facility]. Auth/Tracking number [id number].  18:43- Writer called and spoke with [facility] [phone]) and confirmed they received the Exception and Regional Referral from [facility]. Stated it was going to be reviewed by "nurse and supervisor" and will call [facility] back.  19:22-Writer received phone call from [facility] ([phone]) requesting another copy of Exception and Regional Referral Form, because she was unable to read the copy that was sent from [facility].  19:54-Writer received another phone call from [facility] ([phone]) stating she received it and she spoke with her supervisor and the patient the patient can come tonight (06/21/2018). See separate note for EMTALA  information.  Writer informed the [facility] Social research officer, government Jon Gills).  20:49-Writer received phone call from [facility] requesting updated vitals and Discharge Summary. Information sent and confirmed it was received Mahalia Longest).  20:56-[facility] requesting copy of the patient's Guardianship papers. Writer called and spoke with guardian Dimple Casey M./[employer]-[phone]) and states she will send them via email. Received both via email and fax.   20:36-Guardianship papers faxed and confirmed received Vonna Kotyk).

## 2018-06-22 NOTE — Plan of Care (Signed)
  Problem: Education: Goal: Knowledge of [facility] Education information/materials will improve Outcome: Completed/Met Goal: Emotional status will improve Outcome: Completed/Met Goal: Mental status will improve Outcome: Completed/Met Goal: Verbalization of understanding the information provided will improve Outcome: Completed/Met   Problem: Coping: Goal: Ability to interact with others will improve Outcome: Completed/Met Goal: Demonstration of participation in decision-making regarding own care will improve Outcome: Completed/Met   Problem: Self-Concept: Goal: Will verbalize positive feelings about self Outcome: Completed/Met   Problem: Activity: Goal: Interest or engagement in leisure activities will improve Outcome: Completed/Met   Problem: Education: Goal: Knowledge of General Education information will improve Description Including pain rating scale, medication(s)/side effects and non-pharmacologic comfort measures Outcome: Completed/Met   Problem: Health Behavior/Discharge Planning: Goal: Ability to manage health-related needs will improve Outcome: Completed/Met   Problem: Clinical Measurements: Goal: Ability to maintain clinical measurements within normal limits will improve Outcome: Completed/Met Goal: Will remain free from infection Outcome: Completed/Met Goal: Diagnostic test results will improve Outcome: Completed/Met Goal: Respiratory complications will improve Outcome: Completed/Met Goal: Cardiovascular complication will be avoided Outcome: Completed/Met   Problem: Activity: Goal: Risk for activity intolerance will decrease Outcome: Completed/Met   Problem: Nutrition: Goal: Adequate nutrition will be maintained Outcome: Completed/Met   Problem: Coping: Goal: Level of anxiety will decrease Outcome: Completed/Met   Problem: Elimination: Goal: Will not experience complications related to bowel motility Outcome: Completed/Met Goal: Will  not experience complications related to urinary retention Outcome: Completed/Met   Problem: Pain Managment: Goal: General experience of comfort will improve Outcome: Completed/Met   Problem: Safety: Goal: Ability to remain free from injury will improve Outcome: Completed/Met   Problem: Skin Integrity: Goal: Risk for impaired skin integrity will decrease Outcome: Completed/Met   Problem: Education: Goal: Ability to verbalize precipitating factors for violent behavior will improve Outcome: Completed/Met   Problem: Health Behavior/Discharge Planning: Goal: Ability to implement measures to prevent violent behavior in the future will improve Outcome: Completed/Met   Problem: Safety: Goal: Ability to demonstrate self-control will improve Outcome: Completed/Met Goal: Ability to redirect hostility and anger into socially appropriate behaviors will improve Outcome: Completed/Met

## 2018-06-22 NOTE — Progress Notes (Signed)
  [facility] Adult Case Management Discharge Plan :  Will you be returning to the same living situation after discharge:  No. At discharge, do you have transportation home?: Yes,  [facility] transport Do you have the ability to pay for your medications: Yes,  Medicaid  Release of information consent forms completed and in the chart;  Patient's signature needed at discharge.  Patient to Follow up at: Follow-up Information    [facility]. Go on 06/22/2018.   Specialty:  Behavioral Health Why:  Patient was transferred to [facility] at dischrge. Contact information: [address] [place] [address] [phone]          Next level of care provider has access to [facility] Link:no  Safety Planning and Suicide Prevention discussed: Yes,  Completed with Guardian  Have you used any form of tobacco in the last 30 days? (Cigarettes, Smokeless Tobacco, Cigars, and/or Pipes): Yes  Has patient been referred to the Quitline?: Patient refused referral  Patient has been referred for addiction treatment: N/A  Johny Shears, LCSW 06/22/2018, 9:19 AM

## 2018-06-22 NOTE — Progress Notes (Signed)
LATE ENTRY: Patient found asleep in back TV room upon my arrival. Patient is cooperative with staff and compliant with 1:1. Has no complaints. Denies SI/HI/AVH at this time. Denies pain.  Patient is happy when told he will be transferred to [facility] tonight. Patient is compliant with process.
